# Patient Record
Sex: Male | Born: 1980
Health system: Southern US, Community
[De-identification: ages and names within clinical notes are randomized; demographics above are authoritative.]

---

## 2012-07-23 ENCOUNTER — Encounter (HOSPITAL_COMMUNITY): Payer: Self-pay | Admitting: Emergency Medicine

## 2012-07-23 ENCOUNTER — Emergency Department (INDEPENDENT_AMBULATORY_CARE_PROVIDER_SITE_OTHER)
Admission: EM | Admit: 2012-07-23 | Discharge: 2012-07-23 | Disposition: A | Discharge status: Home / Self Care | Payer: Self-pay | Source: Home / Self Care | Attending: Emergency Medicine | Admitting: Emergency Medicine

## 2012-07-23 DIAGNOSIS — IMO0002 Reserved for concepts with insufficient information to code with codable children: Secondary | ICD-10-CM

## 2012-07-23 DIAGNOSIS — T148XXA Other injury of unspecified body region, initial encounter: Secondary | ICD-10-CM

## 2012-07-23 MED ORDER — TETANUS-DIPHTH-ACELL PERTUSSIS 5-2.5-18.5 LF-MCG/0.5 IM SUSP
INTRAMUSCULAR | Status: AC
  Filled 2012-07-23: qty 0.5

## 2012-07-23 MED ORDER — TETANUS-DIPHTH-ACELL PERTUSSIS 5-2.5-18.5 LF-MCG/0.5 IM SUSP
0.5000 mL | Freq: Once | INTRAMUSCULAR | Status: AC
  Administered 2012-07-23: 0.5 mL via INTRAMUSCULAR

## 2012-07-23 NOTE — ED Notes (Signed)
Laceration to left hand thumb side. Pt states that about 6:45 p.m he was doing some work and cut his had with a utility knife. Left hand is wrapped and no bleeding at this time.Pt does not remember last tetanus.

## 2012-07-23 NOTE — ED Provider Notes (Signed)
Chief Complaint  Patient presents with  . Extremity Laceration    History of Present Illness:  Benjamin Porter is a 31 year old male who was lacerated and his left thenar eminence this evening around 6:45 PM with a utility knife. He states you to deny this claim. His last shot was more than 10 years ago. He is able to move all his extremities well. He denies any numbness or tingling.  Review of Systems:  Other than noted above, the patient denies any of the following symptoms: Systemic:  No fever or chills. Musculoskeletal:  No joint pain or decreased range of motion. Neuro:  No numbness, tingling, or weakness.  PMFSH:  Past medical history, family history, social history, meds, and allergies were reviewed.  Physical Exam:   Vital signs:  BP 128/83  Pulse 74  Temp 98.7 F (37.1 C) (Oral)  Resp 16  SpO2 100% Ext:  There is a 2.5 cm flap laceration on the thenar eminence. There was no visible contamination.  All joints had a full ROM without pain.  Pulses were full.  Good capillary refill in all digits.  No edema. Neurological:  Alert and oriented.  No muscle weakness.  Sensation was intact to light touch.   Procedure: Verbal informed consent was obtained.  The patient was informed of the risks and benefits of the procedure and understands and accepts.  Identity of the patient was verified verbally and by wristband.   The laceration area described above was prepped with Betadine and saline  and anesthetized with 10 mL of 2% Xylocaine without epinephrine.  The wound was then closed as follows:  The wound was closed with 8 5-0 nylon sutures.  There were no immediate complications, and the patient tolerated the procedure well. The laceration was then cleansed, Bacitracin ointment was applied and a clean, dry pressure dressing was put on.   Medications given in [facility]:  He was given a Tdap vaccine and tolerated this well without any immediate side effects.  Assessment:  The encounter diagnosis was  Laceration.  Plan:   1.  The following meds were prescribed:   New Prescriptions   No medications on file   2.  The patient was instructed in wound care and pain control, and handouts were given. 3.  The patient was told to return in 14 days for suture removal or wound recheck or sooner if any sign of infection.     Reuben Likes, MD 07/23/12 2155

## 2014-01-11 ENCOUNTER — Emergency Department (INDEPENDENT_AMBULATORY_CARE_PROVIDER_SITE_OTHER): Payer: BC Managed Care – PPO

## 2014-01-11 ENCOUNTER — Emergency Department (HOSPITAL_COMMUNITY)
Admission: EM | Admit: 2014-01-11 | Discharge: 2014-01-11 | Disposition: A | Discharge status: Home / Self Care | Payer: BC Managed Care – PPO | Source: Home / Self Care | Attending: Family Medicine | Admitting: Family Medicine

## 2014-01-11 ENCOUNTER — Encounter (HOSPITAL_COMMUNITY): Payer: Self-pay | Admitting: Emergency Medicine

## 2014-01-11 DIAGNOSIS — H6690 Otitis media, unspecified, unspecified ear: Secondary | ICD-10-CM

## 2014-01-11 DIAGNOSIS — J019 Acute sinusitis, unspecified: Secondary | ICD-10-CM

## 2014-01-11 DIAGNOSIS — H6693 Otitis media, unspecified, bilateral: Secondary | ICD-10-CM

## 2014-01-11 LAB — POCT RAPID STREP A: STREPTOCOCCUS, GROUP A SCREEN (DIRECT): NEGATIVE

## 2014-01-11 MED ORDER — IPRATROPIUM BROMIDE 0.06 % NA SOLN: 2.0000 | Freq: Four times a day (QID) | NASAL | Status: DC

## 2014-01-11 MED ORDER — MINOCYCLINE HCL 100 MG PO CAPS: 100.0000 mg | ORAL_CAPSULE | Freq: Two times a day (BID) | ORAL | Status: DC

## 2014-01-11 NOTE — ED Provider Notes (Signed)
CSN: [id number]     Arrival date & time 01/11/14  1613 History   First MD Initiated Contact with Patient 01/11/14 1654     Chief Complaint  Patient presents with  . URI   (Consider location/radiation/quality/duration/timing/severity/associated sxs/prior Treatment) Patient is a 33 y.o. male presenting with URI. The history is provided by the patient.  URI Presenting symptoms: congestion, cough, ear pain, fever and rhinorrhea   Severity:  Moderate Onset quality:  Gradual Duration:  5 days Progression:  Worsening Chronicity:  New Associated symptoms: sinus pain     History reviewed. No pertinent past medical history. History reviewed. No pertinent past surgical history. No family history on file. History  Substance Use Topics  . Smoking status: Never Smoker   . Smokeless tobacco: Not on file  . Alcohol Use: No    Review of Systems  Constitutional: Positive for fever.  HENT: Positive for congestion, ear pain and rhinorrhea.   Respiratory: Positive for cough. Negative for shortness of breath.   Gastrointestinal: Negative.   Genitourinary: Negative.     Allergies  Amoxicillin  Home Medications   Current Outpatient Rx  Name  Route  Sig  Dispense  Refill  . ipratropium (ATROVENT) 0.06 % nasal spray   Each Nare   Place 2 sprays into both nostrils 4 (four) times daily.   15 mL   1   . minocycline (MINOCIN,DYNACIN) 100 MG capsule   Oral   Take 1 capsule (100 mg total) by mouth 2 (two) times daily.   20 capsule   0    BP 132/83  Pulse 93  Temp(Src) 101.4 F (38.6 C) (Oral)  Resp 20  SpO2 96% Physical Exam  Nursing note and vitals reviewed. Constitutional: He is oriented to person, place, and time. He appears well-developed and well-nourished.  HENT:  Head: Normocephalic.  Right Ear: Ear canal normal. Tympanic membrane is injected, erythematous and retracted.  Left Ear: Ear canal normal. Tympanic membrane is injected and erythematous. Tympanic membrane mobility  is abnormal.  Ears:  Mouth/Throat: Oropharynx is clear and moist.  Neck: Normal range of motion. Neck supple.  Cardiovascular: Regular rhythm and normal heart sounds.   Pulmonary/Chest: Effort normal and breath sounds normal.  Lymphadenopathy:    He has no cervical adenopathy.  Neurological: He is alert and oriented to person, place, and time.  Skin: Skin is warm and dry.    ED Course  Procedures (including critical care time) Labs Review Labs Reviewed  POCT RAPID STREP A ([facility] ONLY)   Imaging Review Dg Chest 2 View  01/11/2014   CLINICAL DATA:  Cough, fever  EXAM: CHEST  2 VIEW  COMPARISON:  None.  FINDINGS: The heart size and mediastinal contours are within normal limits. Both lungs are clear. The visualized skeletal structures are unremarkable.  IMPRESSION: No active cardiopulmonary disease.   Electronically Signed   By: Elige KoHetal  Patel   On: 01/11/2014 17:36   X-rays reviewed and report per radiologist.   MDM   1. Sinusitis, acute   2. Otitis media of both ears        Linna HoffJames D Kindl, MD 01/11/14 1759

## 2014-01-11 NOTE — ED Notes (Signed)
Patient complains of chest congestion, head congestion, with cough fever; also states ear pressure; states can't sleep or lie down to sleep; states when lying down he is so congested he vomits.

## 2014-01-11 NOTE — Discharge Instructions (Signed)
Take all of medicine , use tylenol or advil for pain and fever as needed, see your doctor in 10 - 14 days for ear recheck or return as needed.

## 2014-01-13 LAB — CULTURE, GROUP A STREP

## 2014-02-22 ENCOUNTER — Emergency Department (INDEPENDENT_AMBULATORY_CARE_PROVIDER_SITE_OTHER): Payer: BC Managed Care – PPO

## 2014-02-22 ENCOUNTER — Encounter (HOSPITAL_COMMUNITY): Payer: Self-pay | Admitting: Emergency Medicine

## 2014-02-22 ENCOUNTER — Emergency Department (HOSPITAL_COMMUNITY)
Admission: EM | Admit: 2014-02-22 | Discharge: 2014-02-22 | Disposition: A | Discharge status: Home / Self Care | Payer: BC Managed Care – PPO | Source: Home / Self Care | Attending: Family Medicine | Admitting: Family Medicine

## 2014-02-22 DIAGNOSIS — M940 Chondrocostal junction syndrome [Tietze]: Secondary | ICD-10-CM

## 2014-02-22 MED ORDER — DICLOFENAC SODIUM 75 MG PO TBEC: 75.0000 mg | DELAYED_RELEASE_TABLET | Freq: Two times a day (BID) | ORAL | Status: DC | PRN

## 2014-02-22 NOTE — ED Provider Notes (Signed)
Robbie LisCharles Warnell is a 33 y.o. male who presents to Urgent Care today for left lateral rib pain present for about 2 weeks without injury. Pain is worse with coughing and deep inspiration. Patient is also work with picking up heavy objects. He notes that he's been coughing a lot recently. He denies any current fevers or chills nausea vomiting or diarrhea. He denies any trouble breathing. The pain is sharp located in the left lateral to anterior rib cage. The pain is moderate to severe but brief. He tried ibuprofen which has not helped much.   History reviewed. No pertinent past medical history. History  Substance Use Topics  . Smoking status: Never Smoker   . Smokeless tobacco: Not on file  . Alcohol Use: No   ROS as above Medications: No current facility-administered medications for this encounter.   Current Outpatient Prescriptions  Medication Sig Dispense Refill  . Multiple Vitamins-Minerals (MULTIVITAMIN PO) Take by mouth.      . diclofenac (VOLTAREN) 75 MG EC tablet Take 1 tablet (75 mg total) by mouth 2 (two) times daily as needed.  60 tablet  0  . ipratropium (ATROVENT) 0.06 % nasal spray Place 2 sprays into both nostrils 4 (four) times daily.  15 mL  1  . minocycline (MINOCIN,DYNACIN) 100 MG capsule Take 1 capsule (100 mg total) by mouth 2 (two) times daily.  20 capsule  0    Exam:  BP 112/71  Pulse 72  Temp(Src) 98.4 F (36.9 C) (Oral)  Resp 12  SpO2 100% Gen: Well NAD HEENT: EOMI,  MMM Lungs: Normal work of breathing. CTABL Heart: RRR no MRG Chest wall; tender palpation left lateral to anterior chest wall Abd: NABS, Soft. NT, ND Exts: Brisk capillary refill, warm and well perfused.   No results found for this or any previous visit (from the past 24 hour(s)). Dg Chest 2 View  02/22/2014   CLINICAL DATA:  Cough.  EXAM: CHEST  2 VIEW  COMPARISON:  DG RIBS UNILATERAL*L* dated 02/22/2014; DG CHEST 2 VIEW dated 01/11/2014  FINDINGS: Mediastinum and hilar structures normal. Lungs  are clear. Heart size normal.  IMPRESSION: No acute cardiopulmonary disease.   Electronically Signed   By: Maisie Fushomas  Register   On: 02/22/2014 10:30   Dg Ribs Unilateral Left  02/22/2014   CLINICAL DATA:  Cough.  Rib pain.  EXAM: LEFT RIBS - 2 VIEW  COMPARISON:  DG CHEST 2 VIEW dated 01/11/2014  FINDINGS: No fracture or other bone lesions are seen involving the ribs. No pneumothorax. No acute cardiopulmonary disease noted.  IMPRESSION: Negative.   Electronically Signed   By: Maisie Fushomas  Register   On: 02/22/2014 10:29    Assessment and Plan: [age] y.o. male with costochondritis. Plan to treat with diclofenac. Followup with primary care provider as needed.  Discussed warning signs or symptoms. Please see discharge instructions. Patient expresses understanding.    Rodolph BongEvan S Joy Haegele, MD 02/22/14 1153

## 2014-02-22 NOTE — Discharge Instructions (Signed)
Thank you for coming in today. Take voltaren twice daily as needed.  Call or go to the emergency room if you get worse, have trouble breathing, have chest pains, or palpitations.   Chest Wall Pain Chest wall pain is pain in or around the bones and muscles of your chest. It may take up to 6 weeks to get better. It may take longer if you must stay physically active in your work and activities.  CAUSES  Chest wall pain may happen on its own. However, it may be caused by:  A viral illness like the flu.  Injury.  Coughing.  Exercise.  Arthritis.  Fibromyalgia.  Shingles. HOME CARE INSTRUCTIONS   Avoid overtiring physical activity. Try not to strain or perform activities that cause pain. This includes any activities using your chest or your abdominal and side muscles, especially if heavy weights are used.  Put ice on the sore area.  Put ice in a plastic bag.  Place a towel between your skin and the bag.  Leave the ice on for 15-20 minutes per hour while awake for the first 2 days.  Only take over-the-counter or prescription medicines for pain, discomfort, or fever as directed by your caregiver. SEEK IMMEDIATE MEDICAL CARE IF:   Your pain increases, or you are very uncomfortable.  You have a fever.  Your chest pain becomes worse.  You have new, unexplained symptoms.  You have nausea or vomiting.  You feel sweaty or lightheaded.  You have a cough with phlegm (sputum), or you cough up blood. MAKE SURE YOU:   Understand these instructions.  Will watch your condition.  Will get help right away if you are not doing well or get worse. Document Released: 10/09/2005 Document Revised: 01/01/2012 Document Reviewed: 06/05/2011 [facility] Patient Information 2014 [place], [place].

## 2014-02-22 NOTE — ED Notes (Signed)
Reports extended cough from sinus infection followed by allergies x approx 1 month.  Approx 2 wks ago noticed a left mid-back pain while moving something at work.  Shortly thereafter started with a rib tenderness below left axilla radiating around to sternum; c/o "excruciating" pain to rib when sneezing.  Also c/o slight cough, but unable to bring up sputum.  Has taken IBU.

## 2014-05-11 ENCOUNTER — Emergency Department (HOSPITAL_COMMUNITY)
Admission: EM | Admit: 2014-05-11 | Discharge: 2014-05-11 | Disposition: A | Discharge status: Home / Self Care | Payer: BC Managed Care – PPO | Source: Home / Self Care

## 2014-05-11 ENCOUNTER — Encounter (HOSPITAL_COMMUNITY): Payer: Self-pay | Admitting: Emergency Medicine

## 2014-05-11 DIAGNOSIS — J020 Streptococcal pharyngitis: Secondary | ICD-10-CM

## 2014-05-11 LAB — POCT RAPID STREP A: Streptococcus, Group A Screen (Direct): NEGATIVE

## 2014-05-11 MED ORDER — AZITHROMYCIN 250 MG PO TABS: ORAL_TABLET | ORAL | Status: DC

## 2014-05-11 NOTE — ED Notes (Signed)
Pt  Reports  Symptoms  Of  sorethroat      X  3  Days  Had  Fever  Yesterday   Some  Congestion  And  Pain  When  Swallowing

## 2014-05-11 NOTE — ED Provider Notes (Signed)
CSN: [id number]     Arrival date & time 05/11/14  1110 History   None    Chief Complaint  Patient presents with  . Sore Throat   (Consider location/radiation/quality/duration/timing/severity/associated sxs/prior Treatment) HPI He is here today for evaluation of sore throat. He states his symptoms started on Friday night, with some mild throat eructation and headache. On Saturday he developed a sore throat, continued headache, and fever to 101.3. This was associated with some body aches. He also describes some tender lymph nodes in his neck. States he saw some exudate on his tonsils. He was at a party approximately one week ago, where he was exposed to a child with known strep pharyngitis. He is tolerating by mouth well.  History reviewed. No pertinent past medical history. History reviewed. No pertinent past surgical history. History reviewed. No pertinent family history. History  Substance Use Topics  . Smoking status: Never Smoker   . Smokeless tobacco: Not on file  . Alcohol Use: No    Review of Systems  Constitutional: Positive for fever. Negative for appetite change.  HENT: Positive for sore throat. Negative for rhinorrhea, sinus pressure and trouble swallowing.   Eyes: Negative.   Respiratory: Negative for cough and shortness of breath.   Musculoskeletal: Positive for myalgias.  Skin: Negative for rash.  Neurological: Positive for headaches.    Allergies  Amoxicillin  Home Medications   Prior to Admission medications   Medication Sig Start Date End Date Taking? Authorizing Provider  azithromycin (ZITHROMAX Z-PAK) 250 MG tablet Take 2 tablets today, then 1 tablet daily until gone. 05/11/14   Charm RingsErin J Trecia Maring, MD  diclofenac (VOLTAREN) 75 MG EC tablet Take 1 tablet (75 mg total) by mouth 2 (two) times daily as needed. 02/22/14   Rodolph BongEvan S Corey, MD  ipratropium (ATROVENT) 0.06 % nasal spray Place 2 sprays into both nostrils 4 (four) times daily. 01/11/14   Linna HoffJames D Kindl, MD   minocycline (MINOCIN,DYNACIN) 100 MG capsule Take 1 capsule (100 mg total) by mouth 2 (two) times daily. 01/11/14   Linna HoffJames D Kindl, MD  Multiple Vitamins-Minerals (MULTIVITAMIN PO) Take by mouth.    Historical Provider, MD   BP 129/88  Pulse 73  Temp(Src) 98.3 F (36.8 C) (Oral)  Resp 12  SpO2 100% Physical Exam  Constitutional: He appears well-developed and well-nourished. No distress.  HENT:  Head: Normocephalic and atraumatic.  Mouth/Throat: Mucous membranes are normal. Oropharyngeal exudate and posterior oropharyngeal erythema present. No tonsillar abscesses.  4+ tonsils  Neck: Normal range of motion.  Cardiovascular: Normal rate, regular rhythm and normal heart sounds.   Pulmonary/Chest: Effort normal and breath sounds normal. He has no wheezes. He has no rales.  Lymphadenopathy:    He has cervical adenopathy.  Skin: He is not diaphoretic.    ED Course  Procedures (including critical care time) Labs Review Labs Reviewed  POCT RAPID STREP A ([facility] ONLY)    Imaging Review No results found.   MDM   1. Strep throat    Rapid strep is negative. However, he has a known exposure and his centor score is 4, indicating high probability of strep pharyngitis. Given his allergy to amoxicillin, will treat with Z-Pak. Followup as needed.    Charm RingsErin J Seher Schlagel, MD 05/11/14 [phone]

## 2014-05-11 NOTE — Discharge Instructions (Signed)
You likely have strep throat. Take azithromycin - 2 tablets today, then 1 a day until gone.  If you symptoms persist after completing the antibiotics, please come back.

## 2014-05-13 LAB — CULTURE, GROUP A STREP

## 2015-12-18 ENCOUNTER — Emergency Department (HOSPITAL_COMMUNITY)
Admission: EM | Admit: 2015-12-18 | Discharge: 2015-12-18 | Disposition: A | Discharge status: Home / Self Care | Payer: BLUE CROSS/BLUE SHIELD | Attending: Emergency Medicine | Admitting: Emergency Medicine

## 2015-12-18 ENCOUNTER — Encounter (HOSPITAL_COMMUNITY): Payer: Self-pay | Admitting: Emergency Medicine

## 2015-12-18 ENCOUNTER — Emergency Department (HOSPITAL_COMMUNITY): Payer: BLUE CROSS/BLUE SHIELD

## 2015-12-18 DIAGNOSIS — Z79899 Other long term (current) drug therapy: Secondary | ICD-10-CM | POA: Diagnosis not present

## 2015-12-18 DIAGNOSIS — R0781 Pleurodynia: Secondary | ICD-10-CM | POA: Insufficient documentation

## 2015-12-18 DIAGNOSIS — R059 Cough, unspecified: Secondary | ICD-10-CM

## 2015-12-18 DIAGNOSIS — Z880 Allergy status to penicillin: Secondary | ICD-10-CM | POA: Diagnosis not present

## 2015-12-18 DIAGNOSIS — Z8739 Personal history of other diseases of the musculoskeletal system and connective tissue: Secondary | ICD-10-CM | POA: Insufficient documentation

## 2015-12-18 DIAGNOSIS — R0981 Nasal congestion: Secondary | ICD-10-CM | POA: Insufficient documentation

## 2015-12-18 DIAGNOSIS — R05 Cough: Secondary | ICD-10-CM | POA: Insufficient documentation

## 2015-12-18 NOTE — ED Notes (Signed)
Pt reports he has had URI symptoms and a cough for the past week. Pt had had R armpit/rib pain with coughing since yesterday. Pt had a coughing spell this am and felt a pop on his R lateral rib area. Since then rib pain with coughing/movement has been worse. Pt had tested positive for strep throat earlier in the week, but he has completed his abx.

## 2015-12-18 NOTE — Discharge Instructions (Signed)
Use your diclofenac for your rib pain. Use the cough suppressant you have at home or OTC delsym or robitussin for cough relief. Continue icing the area if this helps your pain. Schedule a follow up with a PCP (one of your choosing or from the resource guide) if your symptoms do not improve.    Chest Wall Pain Chest wall pain is pain in or around the bones and muscles of your chest. Sometimes, an injury causes this pain. Sometimes, the cause may not be known. This pain may take several weeks or longer to get better. HOME CARE INSTRUCTIONS  Pay attention to any changes in your symptoms. Take these actions to help with your pain:   Rest as told by your health care provider.   Avoid activities that cause pain. These include any activities that use your chest muscles or your abdominal and side muscles to lift heavy items.   If directed, apply ice to the painful area:  Put ice in a plastic bag.  Place a towel between your skin and the bag.  Leave the ice on for 20 minutes, 2-3 times per day.  Take over-the-counter and prescription medicines only as told by your health care provider.  Do not use tobacco products, including cigarettes, chewing tobacco, and e-cigarettes. If you need help quitting, ask your health care provider.  Keep all follow-up visits as told by your health care provider. This is important. SEEK MEDICAL CARE IF:  You have a fever.  Your chest pain becomes worse.  You have new symptoms. SEEK IMMEDIATE MEDICAL CARE IF:  You have nausea or vomiting.  You feel sweaty or light-headed.  You have a cough with phlegm (sputum) or you cough up blood.  You develop shortness of breath.   This information is not intended to replace advice given to you by your health care provider. Make sure you discuss any questions you have with your health care provider.   Document Released: 10/09/2005 Document Revised: 06/30/2015 Document Reviewed: 01/04/2015 Elsevier Interactive  Patient Education 2016 Elsevier Inc.  Cough, Adult Coughing is a reflex that clears your throat and your airways. Coughing helps to heal and protect your lungs. It is normal to cough occasionally, but a cough that happens with other symptoms or lasts a long time may be a sign of a condition that needs treatment. A cough may last only 2-3 weeks (acute), or it may last longer than 8 weeks (chronic). CAUSES Coughing is commonly caused by:  Breathing in substances that irritate your lungs.  A viral or bacterial respiratory infection.  Allergies.  Asthma.  Postnasal drip.  Smoking.  Acid backing up from the stomach into the esophagus (gastroesophageal reflux).  Certain medicines.  Chronic lung problems, including COPD (or rarely, lung cancer).  Other medical conditions such as heart failure. HOME CARE INSTRUCTIONS  Pay attention to any changes in your symptoms. Take these actions to help with your discomfort:  Take medicines only as told by your health care provider.  If you were prescribed an antibiotic medicine, take it as told by your health care provider. Do not stop taking the antibiotic even if you start to feel better.  Talk with your health care provider before you take a cough suppressant medicine.  Drink enough fluid to keep your urine clear or pale yellow.  If the air is dry, use a cold steam vaporizer or humidifier in your bedroom or your home to help loosen secretions.  Avoid anything that causes you to cough  at work or at home.  If your cough is worse at night, try sleeping in a semi-upright position.  Avoid cigarette smoke. If you smoke, quit smoking. If you need help quitting, ask your health care provider.  Avoid caffeine.  Avoid alcohol.  Rest as needed. SEEK MEDICAL CARE IF:   You have new symptoms.  You cough up pus.  Your cough does not get better after 2-3 weeks, or your cough gets worse.  You cannot control your cough with suppressant  medicines and you are losing sleep.  You develop pain that is getting worse or pain that is not controlled with pain medicines.  You have a fever.  You have unexplained weight loss.  You have night sweats. SEEK IMMEDIATE MEDICAL CARE IF:  You cough up blood.  You have difficulty breathing.  Your heartbeat is very fast.   This information is not intended to replace advice given to you by your health care provider. Make sure you discuss any questions you have with your health care provider.   Document Released: 04/07/2011 Document Revised: 06/30/2015 Document Reviewed: 12/16/2014 Elsevier Interactive Patient Education 2016 [employer].   Emergency Department Resource Guide 1) Find a Doctor and Pay Out of Pocket Although you won't have to find out who is covered by your insurance plan, it is a good idea to ask around and get recommendations. You will then need to call the office and see if the doctor you have chosen will accept you as a new patient and what types of options they offer for patients who are self-pay. Some doctors offer discounts or will set up payment plans for their patients who do not have insurance, but you will need to ask so you aren't surprised when you get to your appointment.  2) Contact Your Local Health Department Not all health departments have doctors that can see patients for sick visits, but many do, so it is worth a call to see if yours does. If you don't know where your local health department is, you can check in your phone book. The CDC also has a tool to help you locate your state's health department, and many state websites also have listings of all of their local health departments.  3) Find a Walk-in Clinic If your illness is not likely to be very severe or complicated, you may want to try a walk in clinic. These are popping up all over the country in pharmacies, drugstores, and shopping centers. They're usually staffed by nurse practitioners or  physician assistants that have been trained to treat common illnesses and complaints. They're usually fairly quick and inexpensive. However, if you have serious medical issues or chronic medical problems, these are probably not your best option.  No Primary Care Doctor: - Call Health Connect at  [phone] - they can help you locate a primary care doctor that  accepts your insurance, provides certain services, etc. - Physician Referral Service- [phone]  Chronic Pain Problems: Organization         Address  Phone   Notes  [facility]  [phone] Patients need to be referred by their primary care doctor.   Medication Assistance: Organization         Address  Phone   Notes  [facility] Medication [facility] [address] [place] [place], [place] [address] [phone] --Must be a resident of [facility] -- Must have NO insurance coverage whatsoever (no Medicaid/ Medicare, etc.) -- The pt.  MUST have a primary care doctor that directs their care regularly and follows them in the community   Med[facility]ist  [phone]   [employer]  [phone]    Agencies that provide inexpensive medical care: Organization         Address  Phone   Notes  [facility]  [phone]   [facility]    [phone]   [facility] [address] [place], [place] [address] [phone]   [facility] [address] [place] [place] [phone]   Planned Parenthood    [phone]   [facility]    [phone]   Community [facility] and [facility]  [address], [place] Phone:  [phone], Fax:  [phone] Hours of Operation:  9 am - 6 pm, M-F.  Also accepts Medicaid/Medicare and self-pay.  [facility] for Children  [address], [place] Phone: [phone], Fax: [phone]. Hours of Operation:  8:30 am - 5:30 pm, M-F.   Also accepts Medicaid and self-pay.  [facility] [place] [address] [place] Phone: [phone]   [facility] [address] Natasha Bence [place], [place] [phone], Ext. [phone] Mondays & Thursdays: 7-9 AM.  First 15 patients are seen on a first come, first serve basis.    Medicaid-accepting [facility] Providers:  Organization         Address  Phone   Notes  [facility] [address],  [phone] Also accepts self-pay patients.  [facility] [address] Laurell Josephs [place], [place]  [phone]   [facility] [address], [place] [phone]   [facility] [address] [place] [phone]   Renaye Rakers [address], [place]   [phone] Only accepts [place] Access [place] patients after they have their name applied to their card.   Self-Pay (no insurance) in [facility]:  Organization         Address  Phone   Notes  Sickle Cell Patients, [facility] [address] [place], [place] [phone]   [facility] [address] [place], [place] [phone]   [facility] [place]  [address], [place] [phone]   [facility]/Dr. Osei-Bonsu  [address], [place] or [address], [place] [phone] Phone number for both [place] and [place] locations is the same.  [facility] and [facility] [address], [place] [phone]   [facility] [address] [place] or [address] [phone]   [facility] [address], [place] [phone], phone; [phone], fax Sees patients 1st and 3rd Saturday of every month.  Must not qualify for public or private insurance (i.e. Medicaid, Medicare, [place] Health Choice, Veterans'  Benefits)  Household income should be no more than 200% of the poverty level The clinic cannot treat you if you are pregnant or think you are pregnant  Sexually transmitted diseases are not treated at the clinic.    Dental Care: Organization         Address  Phone  Notes  [facility] Department of Public Health [facility] [address]  Lynne Logan [phone] Accepts children up to age 68 who are enrolled in Medicaid or [place] Health Choice; pregnant women with a Medicaid card; and children who have applied for Medicaid or [place] Health Choice, but were declined, whose parents can pay a reduced fee at time of service.  [facility]  [address], [place] [phone] Accepts children up to age 51 who are enrolled in [place] or [place] Health Choice; pregnant women with a Medicaid card; and children who have applied for Medicaid or  Health Choice, but were declined, whose parents can pay a reduced fee at time of service.  [facility]  [address] [place], [place] [phone] Patients are seen by appointment only. Walk-ins are not accepted. [facility] will see patients 54 years of age and older. Monday - Tuesday (8am-5pm) Most Wednesdays (8:30-5pm) $30 per visit, cash only  [facility] Access [facility]  [address], [facility] [phone] Patients are seen by appointment only. Walk-ins are not accepted. [facility] will see patients 31 years of age and older. One Wednesday Evening (Monthly: Volunteer Based).  $30 per visit, cash only  [employer]  [phone] for adults; Children under age 56, call Graduate Pediatric Dentistry at [phone]. Children aged 28-14, please call [phone] to request a pediatric application.  Dental services are provided in all areas of dental care including fillings, crowns and bridges, complete and partial  dentures, implants, gum treatment, root canals, and extractions. Preventive care is also provided. Treatment is provided to both adults and children. Patients are selected via a lottery and there is often a waiting list.   [facility] [address], [place]  [phone] [email or web address]   [facility] [address] [place], [place] [phone], Ext. [phone] Second and Fourth Thursday of each month, opens at 6:30 AM; Clinic ends at 9 AM.  Patients are seen on a first-come first-served basis, and a limited number are seen during each clinic.   [facility]  [address] Ether Griffins [place], [place] [phone]   Eligibility Requirements You must have lived in [place], [place], or [place] counties for at least the last three months.   You cannot be eligible for state or federal sponsored [place], including [employer], [place], or [employer].   You generally cannot be eligible for healthcare insurance through your employer.    How to apply: Eligibility screenings are held every Tuesday and Wednesday afternoon from 1:00 pm until 4:00 pm. You do not need an appointment for the interview!  [facility] [address], [place], [place] [phone]   [facility] Health Department  [phone]   [facility] Health Department  [phone]   [facility] Health Department  [phone]    Behavioral Health Resources in the Community: Intensive Outpatient Programs Organization         Address  Phone  Notes  [facility] Services [address], [place], [place] [phone]   [facility] Outpatient [address], [place], [place] [phone]   [facility] [address], [place], [place]  [phone]   [facility] [address],  [place], [place] [phone] or [phone]   Substance Abuse Resources Organization          Address  Phone  Notes  [facility] Drug  Services  [phone]   [facility]  [phone]   [facility] [place]  [phone]   Floydene Flock  [phone]   Residential & Outpatient Substance Abuse Program  [phone]   Psychological Services Organization         Address  Phone  Notes  [facility] Health  [phone]   [facility]  [phone]   [facility] Mental Health [address] [place] [phone] or [phone]    Mobile Crisis Teams Organization         Address  Phone  Notes  [facility], Mobile Crisis Care Unit  [phone]   [facility]  [address] [place], [place] [phone]   Doristine Locks [address] [place] [phone]    Self-Help/Support Groups Organization         Address  Phone             Notes  [facility] [place] - variety of support groups  336- I7437963 Call for more information  Narcotics Anonymous (NA), [facility] [address], [employer] [place]  2 meetings at this location   Statistician         Address  Phone  Notes  [facility] [address],    [place]  [phone]   [facility]  [address] [place] [address], [place], [place] [phone]   [facility] [address] [place], [place] [phone] Admissions: 8am-3pm M-F  [facility] [address],    [place], [place] [phone]   [facility] [address] [place], [place], [place] [phone]   [facility] [address],  [place], [place] [phone]   [facility] - Intensive Outpatient [address] Laurell Josephs [address], [place], [place] [phone]   [facility] ([facility] [address] [place]  [place], [place] [phone] or [phone]   [facility] ([facility]) [address], [place], [place]  [phone] Accepts Medicaid  [facility] [address],  [place] [phone] Substance Abuse/Addiction Treatment   [facility] Organization         Address  Phone  Notes  [facility]  [phone]   Angie Fava, PhD [address] Ervin Knack [place], [place]   5314672118 or [phone]   [facility] Behavioral   [address] [place], [place] ([phone]   [facility] [address], [place], [place] [phone] Insurance/Medicaid/sponsorship through [facility] and Families [address]                                    [place], [place] [phone] Therapy/tele-psych/case  [facility] [address], [place] [phone]    Dr. Lolly Mustache  [phone]   [facility]  [employer] [facility] Dept. 1) [address], [place] 2) [address], [place] 3)  [address], [place] [phone]  [phone]   [facility] Child Abuse Hotline [phone]) [phone] or [phone] (After Hours)

## 2015-12-18 NOTE — ED Notes (Signed)
Patient transported to X-ray 

## 2015-12-18 NOTE — ED Provider Notes (Signed)
CSN: [id number]     Arrival date & time 12/18/15  1210 History  By signing my name below, I, Benjamin Porter, attest that this documentation has been prepared under the direction and in the presence of Benjamin Cuny, PA-C. Electronically Signed: Lyndel Porter, ED Scribe. 12/18/2015. 2:10 PM.   Chief Complaint  Patient presents with  . Cough  . Chest Pain   The history is provided by the patient. No language interpreter was used.   HPI Comments: Benjamin Porter is a 35 y.o. male, with a h/o costochondritis, who presents to the Emergency Department complaining of a cough that has been persistent for 2 weeks, with associated nasal congestion and bright yellow sputum. Pt reports acute onset of right, lateral rib pain this morning during a coughing spell prompting ED evaluation. He states he heard something pop in his right ribs this morning and has experienced exacerbation of the pain with coughing and movement of torso since this incident. Pt admits to improvement of rib pain with ice applied in triage. Additionally he reports episodes of blood streaked sputum after coughing. He has not taken any alleviating medication for cough but has taken tylenol and diclofenac without significant relief of rib pain. He completed an azithromycin course 2 days ago for treatment of strep and denies a sore throat currently. He states that his URI symptoms have improved after treatment and he is mostly concerned about the rib pain. He has prescription cough medication at home but "wanted to get my ribs checked out before I took it". Pt also denies fevers, chills, sinus pressure, sinus headache, sore throat, SOB, difficulty breathing, chest pain, abdominal pain, nausea, vomiting, diarrhea, myalgias or arthralgias.   History reviewed. No pertinent past medical history. History reviewed. No pertinent past surgical history. History reviewed. No pertinent family history. Social History  Substance Use Topics  . Smoking  status: Never Smoker   . Smokeless tobacco: None  . Alcohol Use: No    Review of Systems  Constitutional: Negative for fever and chills.  HENT: Positive for congestion ( nasal). Negative for sinus pressure, sore throat, trouble swallowing and voice change.   Respiratory: Positive for cough. Negative for shortness of breath.   Cardiovascular: Negative for chest pain.  Musculoskeletal: Positive for arthralgias ( right, lateral ribs).  Neurological: Negative for headaches.  All other systems reviewed and are negative.  Allergies  Amoxicillin  Home Medications   Prior to Admission medications   Medication Sig Start Date End Date Taking? Authorizing Provider  azithromycin (ZITHROMAX Z-PAK) 250 MG tablet Take 2 tablets today, then 1 tablet daily until gone. 05/11/14   Benjamin Rings, MD  diclofenac (VOLTAREN) 75 MG EC tablet Take 1 tablet (75 mg total) by mouth 2 (two) times daily as needed. 02/22/14   Benjamin Bong, MD  ipratropium (ATROVENT) 0.06 % nasal spray Place 2 sprays into both nostrils 4 (four) times daily. 01/11/14   Benjamin Hoff, MD  minocycline (MINOCIN,DYNACIN) 100 MG capsule Take 1 capsule (100 mg total) by mouth 2 (two) times daily. 01/11/14   Benjamin Hoff, MD  Multiple Vitamins-Minerals (MULTIVITAMIN PO) Take by mouth.    Historical Provider, MD   BP 132/83 mmHg  Pulse 73  Temp(Src) 98.3 F (36.8 C) (Oral)  Resp 18  SpO2 97% Physical Exam  Constitutional: He is oriented to person, place, and time. He appears well-developed and well-nourished. No distress.  Nontoxic appearing  HENT:  Head: Normocephalic.  Right Ear: Tympanic membrane and ear canal  normal.  Left Ear: Tympanic membrane and ear canal normal.  Nose: Nose normal. Right sinus exhibits no maxillary sinus tenderness and no frontal sinus tenderness. Left sinus exhibits no maxillary sinus tenderness and no frontal sinus tenderness.  Mouth/Throat: Uvula is midline and oropharynx is clear and moist.  Eyes:  Conjunctivae are normal. Right eye exhibits no discharge. Left eye exhibits no discharge.  Neck: Normal range of motion. Neck supple.  Cardiovascular: Normal rate, regular rhythm and normal heart sounds.   Pulmonary/Chest: Effort normal and breath sounds normal. No respiratory distress. He has no wheezes. He has no rales. He exhibits no tenderness.  Patient indicates right lateral ribs as source of pain. No tenderness to palpation over this area. No bony deformities, flail chest, crepitus or step-offs. Breathing unlabored. Symmetric chest expansion.  Musculoskeletal: Normal range of motion.  Neurological: He is alert and oriented to person, place, and time. Coordination normal.  Skin: Skin is warm.  Psychiatric: He has a normal mood and affect. His behavior is normal.  Nursing note and vitals reviewed.   ED Course  Procedures  DIAGNOSTIC STUDIES: Oxygen Saturation is 97% on RA, normal by my interpretation.    COORDINATION OF CARE: 2:06 PM Suspect costochondritis. Pt has diclofenac prescription at home. Discussed unremarkable CXR with pt. Discussed treatment plan with pt. Pt acknowledges and agrees to plan.   Imaging Review Dg Chest 2 View  12/18/2015  CLINICAL DATA:  35 year old male with cough and congestion for 2 weeks. EXAM: CHEST  2 VIEW COMPARISON:  02/22/2014 and prior exam FINDINGS: The cardiomediastinal silhouette is unremarkable. There is no evidence of focal airspace disease, pulmonary edema, suspicious pulmonary nodule/mass, pleural effusion, or pneumothorax. No acute bony abnormalities are identified. IMPRESSION: No active cardiopulmonary disease. Electronically Signed   By: Benjamin Porter M.D.   On: 12/18/2015 13:18   I have personally reviewed and evaluated these images results as part of my medical decision-making.   MDM   Final diagnoses:  Cough  Rib pain on right side   35 year old male presenting with right lateral rib pain after a coughing fit this morning. Patient  has been experiencing URI symptoms for the past 2 weeks. Recently completed azithromycin prescribed from an urgent care. Afebrile and nontoxic appearing. No tenderness on palpation of the right lateral ribs. No bony deformities of the rib cage. Breathing unlabored with normal breath sounds. No nasal congestion noted. Chest x-ray negative for acute disease or rib abnormality. Patient has cough syrup with codeine at home but has not taken this yet. Patient recently finished azithromycin course and I do not believe he needs further antibiotics at this time. Discussed symptomatic care including his home regimen of diclofenac and over-the-counter cough suppressants as needed. Patient states he does not have a PCP but is in the process of establishing care. Given resource guide in discharge report and encouraged to schedule a follow-up appointment if his symptoms do not improve. Return precautions given in discharge paperwork and discussed with pt at bedside. Pt stable for discharge  I personally performed the services described in this documentation, which was scribed in my presence. The recorded information has been reviewed and is accurate.    Rolm Gala Taraann Olthoff, PA-C 12/18/15 1439  Bethann Berkshire, MD 12/19/15 [id number]

## 2017-07-25 IMAGING — CR DG CHEST 2V
2 series · 2 of 2 positions shown · non-contrast
Comparison: 02/22/2014 and prior exam

CLINICAL DATA: 34-year-old male with cough and congestion for 2
weeks.

EXAM:
CHEST  2 VIEW

[w chest pa]
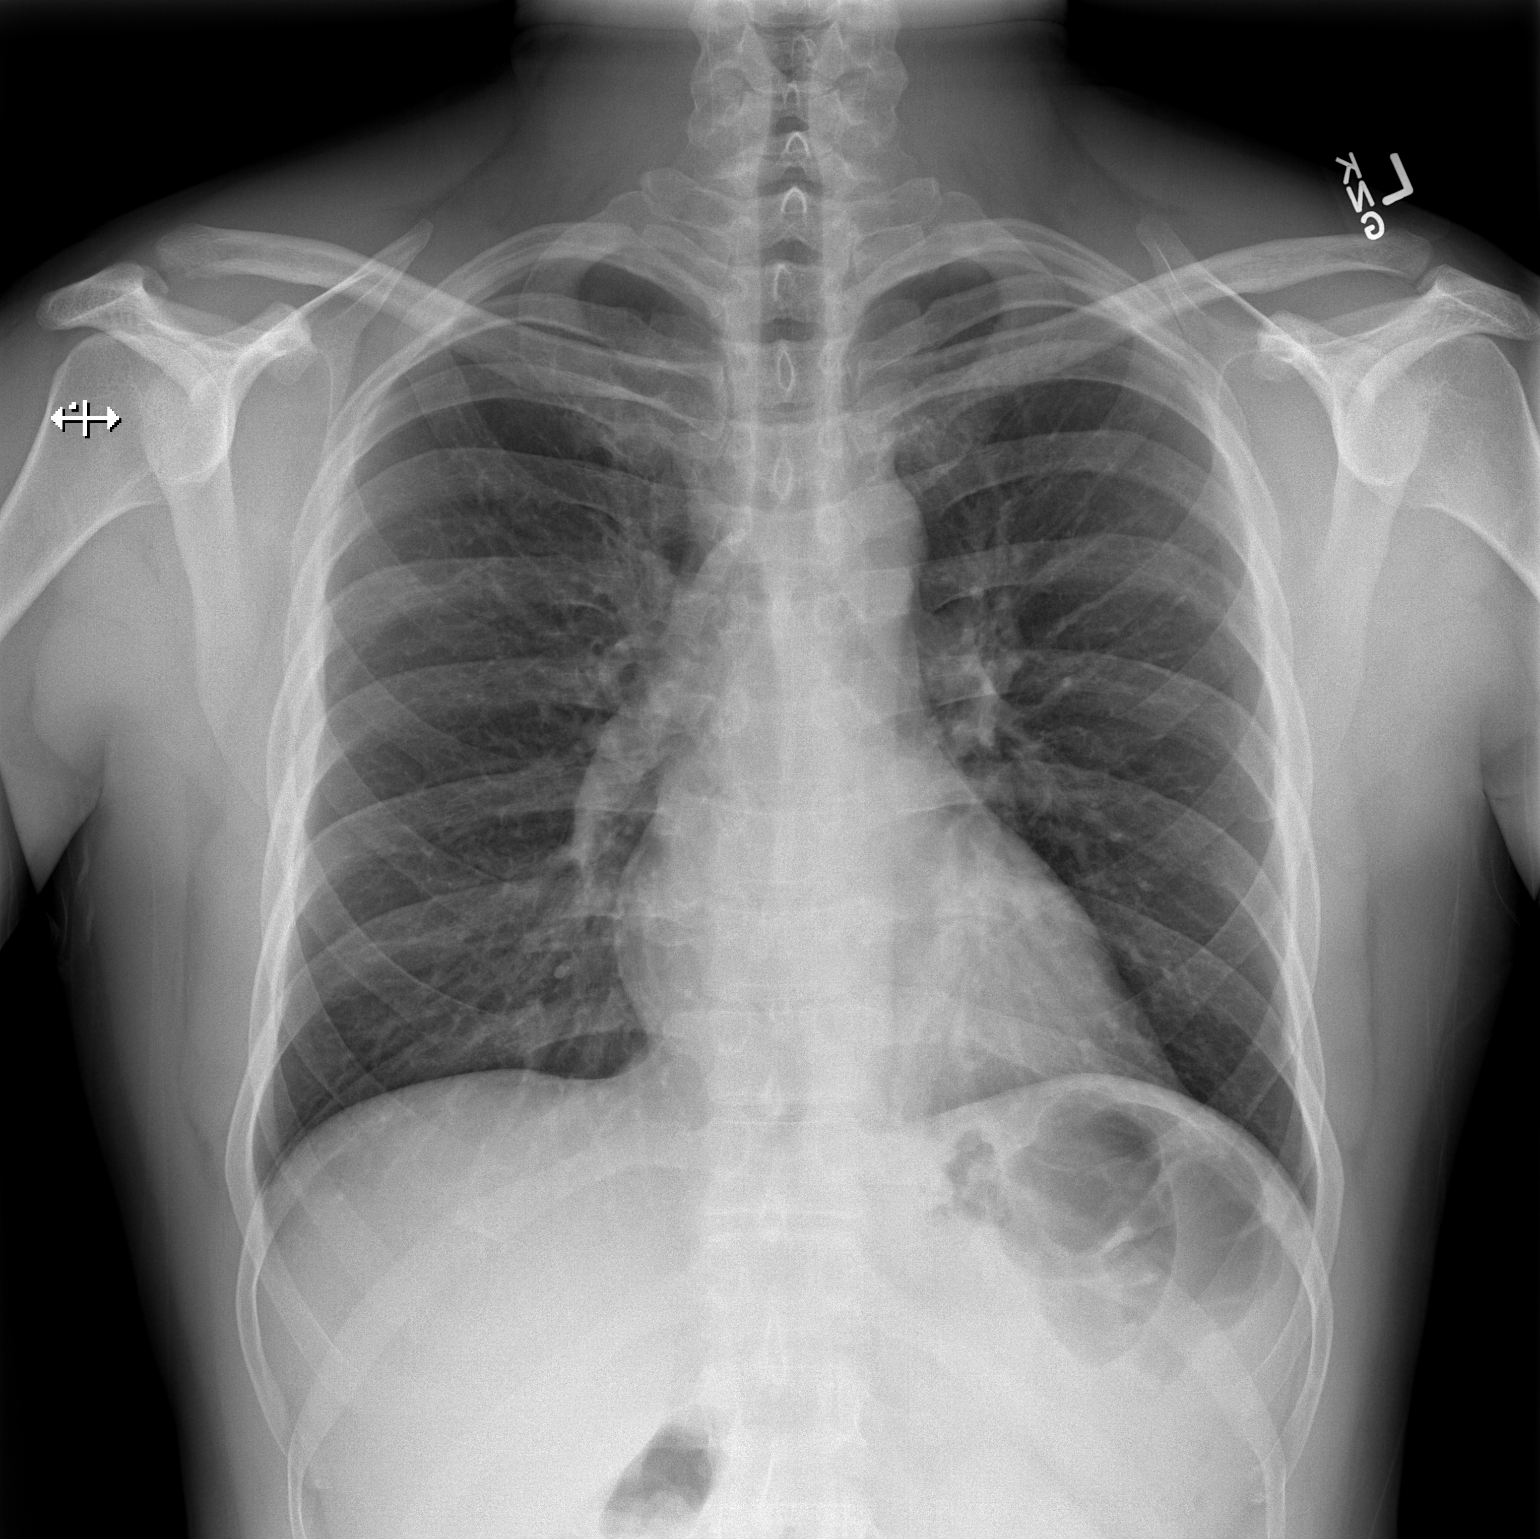

[w chest lat]
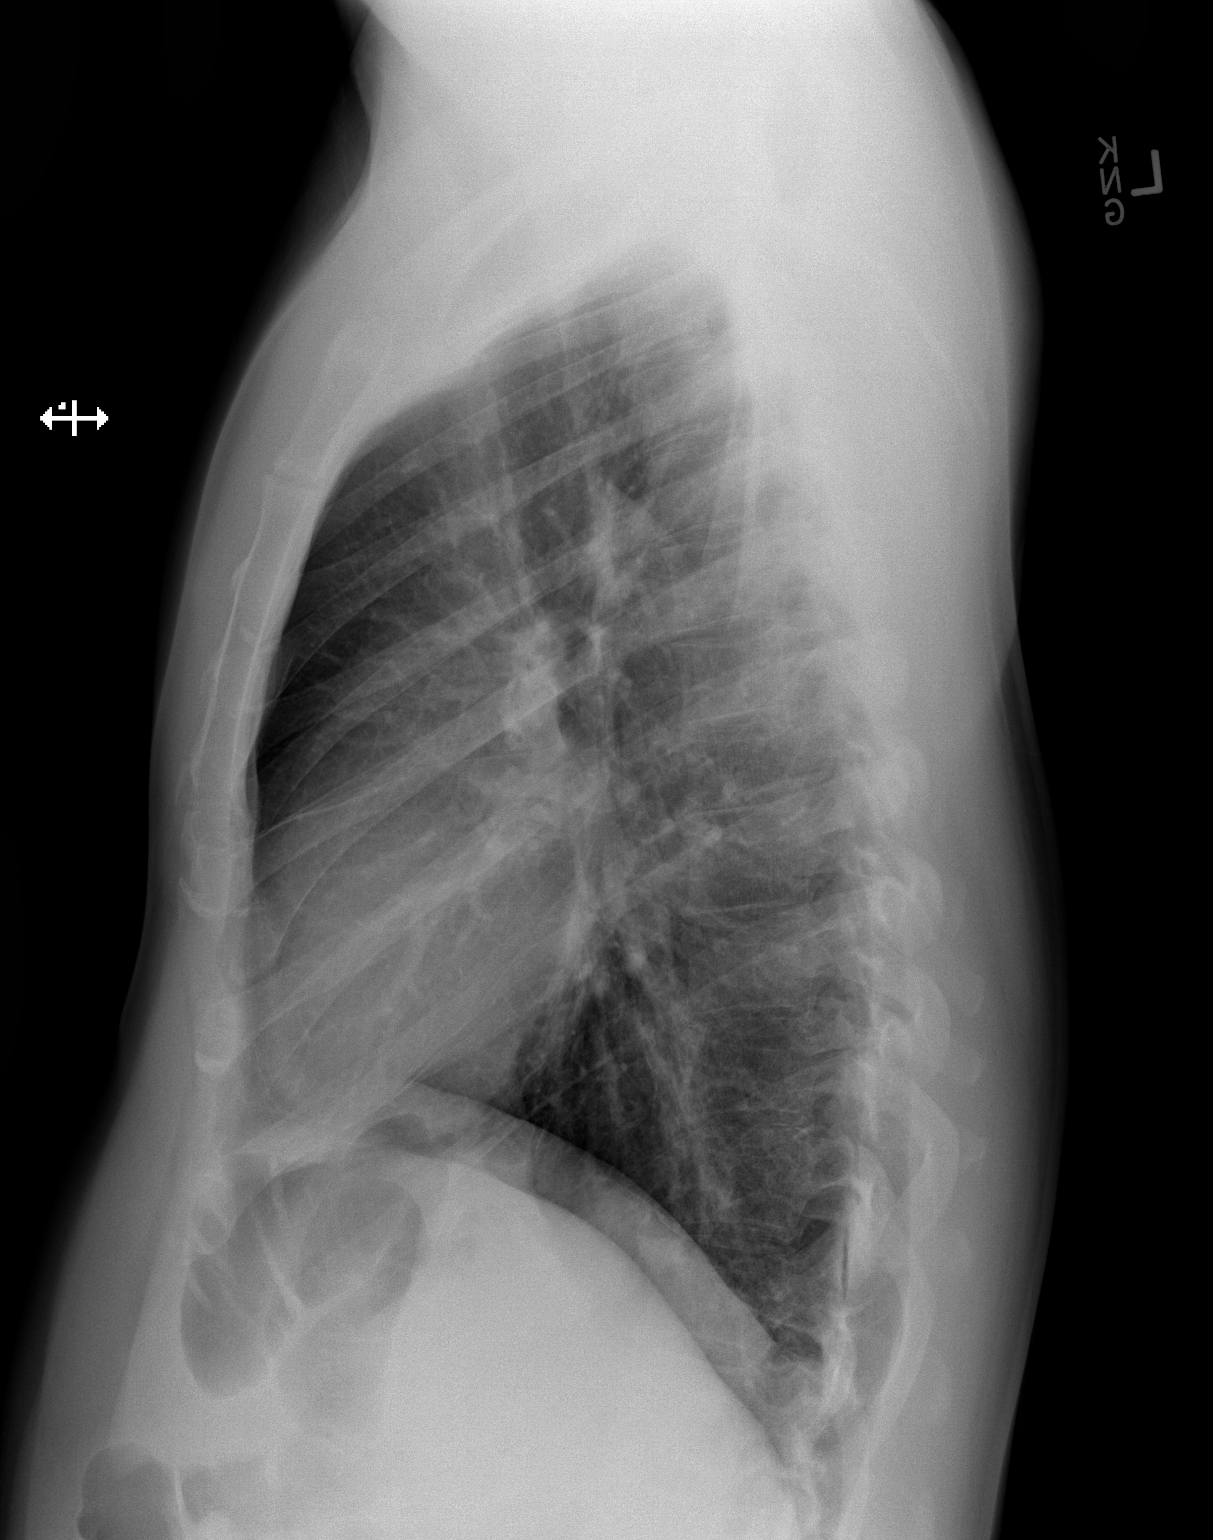

[2 of 2 positions shown; findings below may reference images not displayed]

FINDINGS: The cardiomediastinal silhouette is unremarkable.

There is no evidence of focal airspace disease, pulmonary edema,
suspicious pulmonary nodule/mass, pleural effusion, or pneumothorax.
No acute bony abnormalities are identified.
IMPRESSION: No active cardiopulmonary disease.

## 2017-09-18 ENCOUNTER — Other Ambulatory Visit: Payer: Self-pay

## 2018-11-07 ENCOUNTER — Ambulatory Visit
Admission: EM | Admit: 2018-11-07 | Discharge: 2018-11-07 | Disposition: A | Discharge status: Home / Self Care | Payer: BLUE CROSS/BLUE SHIELD | Attending: Physician Assistant | Admitting: Physician Assistant

## 2018-11-07 ENCOUNTER — Ambulatory Visit: Admit: 2018-11-07 | Discharge status: Absent without leave | Payer: Self-pay | Source: Home / Self Care

## 2018-11-07 DIAGNOSIS — B370 Candidal stomatitis: Secondary | ICD-10-CM

## 2018-11-07 DIAGNOSIS — J029 Acute pharyngitis, unspecified: Secondary | ICD-10-CM

## 2018-11-07 LAB — POCT RAPID STREP A (OFFICE): Rapid Strep A Screen: NEGATIVE

## 2018-11-07 MED ORDER — FLUCONAZOLE 200 MG PO TABS
200.0000 mg | ORAL_TABLET | Freq: Every day | ORAL | 0 refills | Status: AC
Start: 2018-11-07 — End: 2018-11-14

## 2018-11-07 MED ORDER — CEFDINIR 300 MG PO CAPS: 300.0000 mg | ORAL_CAPSULE | Freq: Two times a day (BID) | ORAL | 0 refills | Status: DC

## 2018-11-07 MED ORDER — ACETAMINOPHEN 325 MG PO TABS
650.0000 mg | ORAL_TABLET | Freq: Once | ORAL | Status: AC
  Administered 2018-11-07: 650 mg via ORAL

## 2018-11-07 NOTE — Discharge Instructions (Signed)
Return if any problems.  See your Physician for recheck in 1 week   °

## 2018-11-07 NOTE — ED Triage Notes (Signed)
Pt c/o fever, cough, sore throat x5 days. Took Dayquil at [facility]

## 2018-11-07 NOTE — ED Provider Notes (Signed)
[name]    CSN: [id number] Arrival date & time: 11/07/18  1305     History   Chief Complaint Chief Complaint  Patient presents with  . Fever    HPI Benjamin Porter is a 38 y.o. male.   The history is provided by the patient. No language interpreter was used.  Fever  Max temp prior to arrival:  103 Temp source:  Oral Severity:  Moderate Onset quality:  Gradual Duration:  3 days Timing:  Constant Progression:  Worsening Chronicity:  New Relieved by:  Nothing Worsened by:  Nothing Ineffective treatments:  None tried Associated symptoms: sore throat   Risk factors: no sick contacts    Pt complains of a sore throat.  Pt reports he has had a fever.  He is on his 3rd day of zithromax.  Pt reports he is now getting white spots on the back of his throat. Pt has a histroy of frequent strep History reviewed. No pertinent past medical history.  There are no active problems to display for this patient.   History reviewed. No pertinent surgical history.     Home Medications    Prior to Admission medications   Medication Sig Start Date End Date Taking? Authorizing Provider  cefdinir (OMNICEF) 300 MG capsule Take 1 capsule (300 mg total) by mouth 2 (two) times daily. 11/07/18   Elson Areas, PA-C  fluconazole (DIFLUCAN) 200 MG tablet Take 1 tablet (200 mg total) by mouth daily for 7 days. 11/07/18 11/14/18  Elson Areas, PA-C  Multiple Vitamins-Minerals (MULTIVITAMIN PO) Take by mouth.    [provider]    Family History No family history on file.  Social History Social History   Tobacco Use  . Smoking status: Never Smoker  . Smokeless tobacco: Never Used  Substance Use Topics  . Alcohol use: No  . Drug use: No     Allergies   Amoxicillin   Review of Systems Review of Systems  Constitutional: Positive for fever.  HENT: Positive for sore throat.   All other systems reviewed and are negative.    Physical Exam Triage Vital  Signs ED Triage Vitals  Enc Vitals Group     BP 11/07/18 1337 111/63     Pulse Rate 11/07/18 1337 (!) 106     Resp --      Temp 11/07/18 1337 (!) 103.3 F (39.6 C)     Temp Source 11/07/18 1337 Oral     SpO2 11/07/18 1337 98 %     Weight --      Height --      Head Circumference --      Peak Flow --      Pain Score 11/07/18 1338 0     Pain Loc --      Pain Edu? --      Excl. in GC? --    No data found.  Updated Vital Signs BP 111/63 (BP Location: Left Arm)   Pulse (!) 106   Temp (!) 103.3 F (39.6 C) (Oral)   SpO2 98%   Visual Acuity Right Eye Distance:   Left Eye Distance:   Bilateral Distance:    Right Eye Near:   Left Eye Near:    Bilateral Near:     Physical Exam Vitals signs and nursing note reviewed.  Constitutional:      Appearance: He is well-developed.  HENT:     Head: Normocephalic.     Right Ear: Tympanic membrane normal.  Left Ear: Tympanic membrane normal.     Nose: Nose normal.     Mouth/Throat:     Comments: Erythema, white small spots tongue and throat Eyes:     Pupils: Pupils are equal, round, and reactive to light.  Neck:     Musculoskeletal: Normal range of motion.  Cardiovascular:     Rate and Rhythm: Normal rate.  Pulmonary:     Effort: Pulmonary effort is normal.  Abdominal:     General: There is no distension.  Musculoskeletal: Normal range of motion.  Neurological:     Mental Status: He is alert and oriented to person, place, and time.      UC Treatments / Results  Labs (all labs ordered are listed, but only abnormal results are displayed) Labs Reviewed  CULTURE, GROUP A STREP [facility])  POCT RAPID STREP A (OFFICE)   MDM  Strep is negative, however pt is on zithromax.  Throat looks like thrush.  Pt given rx for Omnicef.  I will also treat for thrush.  Pt given card for primary care.  EKG None  Radiology No results found.  Procedures Procedures (including critical care time)  Medications Ordered in  UC Medications  acetaminophen (TYLENOL) tablet 650 mg (650 mg Oral Given 11/07/18 1342)    Initial Impression / Assessment and Plan / UC Course  I have reviewed the triage vital signs and the nursing notes.  Pertinent labs & imaging results that were available during my care of the patient were reviewed by me and considered in my medical decision making (see chart for details).      Final Clinical Impressions(s) / UC Diagnoses   Final diagnoses:  Acute pharyngitis, unspecified etiology  Thrush     Discharge Instructions     Return if any problems.  See your Physician for recheck in 1 week   ED Prescriptions    Medication Sig Dispense Auth. Provider   fluconazole (DIFLUCAN) 200 MG tablet Take 1 tablet (200 mg total) by mouth daily for 7 days. 7 tablet Paulanthony Gleaves K, [place]   cefdinir (OMNICEF) 300 MG capsule Take 1 capsule (300 mg total) by mouth 2 (two) times daily. 30 capsule Elson Areas, [place]     Controlled Substance Prescriptions [place] Controlled Substance Registry consulted? Not Applicable   Elson Areas, [place] 11/07/18 [id number]

## 2018-11-10 LAB — CULTURE, GROUP A STREP (THRC)

## 2018-11-11 ENCOUNTER — Ambulatory Visit
Admission: EM | Admit: 2018-11-11 | Discharge: 2018-11-11 | Disposition: A | Discharge status: Home / Self Care | Payer: BLUE CROSS/BLUE SHIELD | Attending: Physician Assistant | Admitting: Physician Assistant

## 2018-11-11 ENCOUNTER — Ambulatory Visit: Payer: BLUE CROSS/BLUE SHIELD

## 2018-11-11 DIAGNOSIS — R05 Cough: Secondary | ICD-10-CM | POA: Diagnosis not present

## 2018-11-11 DIAGNOSIS — J209 Acute bronchitis, unspecified: Secondary | ICD-10-CM | POA: Diagnosis not present

## 2018-11-11 MED ORDER — PREDNISONE 50 MG PO TABS: 50.0000 mg | ORAL_TABLET | Freq: Every day | ORAL | 0 refills | Status: DC

## 2018-11-11 NOTE — ED Triage Notes (Signed)
Pt states seen here last Thursday and dx with thrush. States sore throat is better but now having diarrhea from the antibotics. Pt c/o dry cough with chest congestion x3days

## 2018-11-11 NOTE — Discharge Instructions (Addendum)
Continue antibiotics. Pick up a probiotic supplement

## 2018-11-13 NOTE — ED Provider Notes (Signed)
[name]    CSN: [id number] Arrival date & time: 11/11/18  1323     History   Chief Complaint Chief Complaint  Patient presents with  . Cough    HPI Benjamin Porter is a 38 y.o. male.   The history is provided by the patient. No language interpreter was used.  Cough  Cough characteristics:  Non-productive Sputum characteristics:  Nondescript Severity:  Moderate Onset quality:  Gradual Timing:  Constant Progression:  Worsening Chronicity:  New Smoker: no   Relieved by:  Nothing Worsened by:  Nothing Ineffective treatments:  None tried Associated symptoms: sore throat   Pt seem by me last week.  Thrush after zithromax.  Swollen tonsils.  Pt reports throat is better.  Pt reports he is now coughing/  Pt denies any hiv risk. Pt healthy, no cancer history  History reviewed. No pertinent past medical history.  There are no active problems to display for this patient.   History reviewed. No pertinent surgical history.     Home Medications    Prior to Admission medications   Medication Sig Start Date End Date Taking? Authorizing Provider  cefdinir (OMNICEF) 300 MG capsule Take 1 capsule (300 mg total) by mouth 2 (two) times daily. 11/07/18   Elson AreasSofia, Libia Fazzini K, PA-C  fluconazole (DIFLUCAN) 200 MG tablet Take 1 tablet (200 mg total) by mouth daily for 7 days. 11/07/18 11/14/18  Elson AreasSofia, Gracie Gupta K, PA-C  Multiple Vitamins-Minerals (MULTIVITAMIN PO) Take by mouth.    [provider]  predniSONE (DELTASONE) 50 MG tablet Take 1 tablet (50 mg total) by mouth daily. 11/11/18   Elson AreasSofia, Boysie Bonebrake K, PA-C    Family History No family history on file.  Social History Social History   Tobacco Use  . Smoking status: Never Smoker  . Smokeless tobacco: Never Used  Substance Use Topics  . Alcohol use: No  . Drug use: No     Allergies   Amoxicillin   Review of Systems Review of Systems  HENT: Positive for sore throat.   Respiratory: Positive for cough.     All other systems reviewed and are negative.    Physical Exam Triage Vital Signs ED Triage Vitals  Enc Vitals Group     BP 11/11/18 1434 133/85     Pulse Rate 11/11/18 1434 74     Resp 11/11/18 1434 18     Temp 11/11/18 1434 98.3 F (36.8 C)     Temp Source 11/11/18 1434 Oral     SpO2 11/11/18 1434 98 %     Weight --      Height --      Head Circumference --      Peak Flow --      Pain Score 11/11/18 1435 0     Pain Loc --      Pain Edu? --      Excl. in GC? --    No data found.  Updated Vital Signs BP 133/85 (BP Location: Left Arm)   Pulse 74   Temp 98.3 F (36.8 C) (Oral)   Resp 18   SpO2 98%   Visual Acuity Right Eye Distance:   Left Eye Distance:   Bilateral Distance:    Right Eye Near:   Left Eye Near:    Bilateral Near:     Physical Exam Vitals signs and nursing note reviewed.  Constitutional:      Appearance: He is well-developed and normal weight.  HENT:     Head: Normocephalic  and atraumatic.     Nose: Nose normal.     Mouth/Throat:     Mouth: Mucous membranes are moist.  Eyes:     Conjunctiva/sclera: Conjunctivae normal.  Neck:     Musculoskeletal: Neck supple.  Cardiovascular:     Rate and Rhythm: Normal rate and regular rhythm.     Heart sounds: No murmur.  Pulmonary:     Effort: Pulmonary effort is normal. No respiratory distress.     Breath sounds: Normal breath sounds.  Abdominal:     Palpations: Abdomen is soft.     Tenderness: There is no abdominal tenderness.  Musculoskeletal: Normal range of motion.  Skin:    General: Skin is warm and dry.  Neurological:     General: No focal deficit present.     Mental Status: He is alert.  Psychiatric:        Mood and Affect: Mood normal.      UC Treatments / Results  Labs (all labs ordered are listed, but only abnormal results are displayed) Labs Reviewed - No data to display  EKG None  Radiology Dg Chest 2 View  Result Date: 11/11/2018 CLINICAL DATA:  Dry cough on and off  for 2 weeks. EXAM: CHEST - 2 VIEW COMPARISON:  12/18/2015 FINDINGS: The heart size and mediastinal contours are within normal limits. Both lungs are clear. The visualized skeletal structures are unremarkable. IMPRESSION: No active cardiopulmonary disease. Electronically Signed   By: Tollie Ethavid  Kwon M.D.   On: 11/11/2018 15:12    Procedures Procedures (including critical care time)  Medications Ordered in UC Medications - No data to display  Initial Impression / Assessment and Plan / UC Course  I have reviewed the triage vital signs and the nursing notes.  Pertinent labs & imaging results that were available during my care of the patient were reviewed by me and considered in my medical decision making (see chart for details).     MDM  Chest xray reviewed and discussed with pt.  Pt advised to start a probiotic as he has had some diarrhea.  Finish antibiotic.  Pt given rx for prednisone  Final Clinical Impressions(s) / UC Diagnoses   Final diagnoses:  Acute bronchitis, unspecified organism     Discharge Instructions     Continue antibiotics. Pick up a probiotic supplement    ED Prescriptions    Medication Sig Dispense Auth. Provider   predniSONE (DELTASONE) 50 MG tablet Take 1 tablet (50 mg total) by mouth daily. 5 tablet Elson AreasSofia, Wissam Resor K, [place]     Controlled Substance Prescriptions [place] Controlled Substance Registry consulted? Not Applicable   Elson AreasSofia, [place] K, [place] 11/13/18 [id number]

## 2019-11-22 ENCOUNTER — Encounter: Payer: Self-pay | Admitting: Emergency Medicine

## 2019-11-22 ENCOUNTER — Other Ambulatory Visit: Payer: Self-pay

## 2019-11-22 ENCOUNTER — Ambulatory Visit
Admission: EM | Admit: 2019-11-22 | Discharge: 2019-11-22 | Disposition: A | Discharge status: Home / Self Care | Payer: BC Managed Care – PPO | Attending: Emergency Medicine | Admitting: Emergency Medicine

## 2019-11-22 DIAGNOSIS — R002 Palpitations: Secondary | ICD-10-CM | POA: Diagnosis not present

## 2019-11-22 DIAGNOSIS — F439 Reaction to severe stress, unspecified: Secondary | ICD-10-CM

## 2019-11-22 NOTE — ED Provider Notes (Signed)
[name]    CSN: [id number] Arrival date & time: 11/22/19  1123      History   Chief Complaint Chief Complaint  Patient presents with  . Palpitations    HPI Benjamin Porter is a 39 y.o. male without significant medical history presenting for evaluation of palpitations.  States he has had increased episodes of heart fluttering sensation over the last 3 days: States he will have maybe a few times in a 1 to 2-hour period.  Patient states that he will feel out of sync, little anxious, and deep breathing helps with this sensation.  States sometimes he feels the need to cough when this happens: Denies history of heartburn, reflux.  Denies lightheadedness, nausea, vomiting, syncopal event.  Denies history of thyroid or cardiopulmonary disorders.  No change in hair, skin, nails, thirst, urination.  States first time this ever happened was about 2 weeks ago without inciting event.  Patient states that he talked with a coworker who said "it might be stress ".  Patient denies history of depression, anxiety, the states he has been under more stress than usual.  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  States he is also been working with his wife, who has fibromyalgia, to buy their first home.  Patient denies sleep disruption, SI, HI.  Has not taken anything for symptoms.   History reviewed. No pertinent past medical history.  There are no problems to display for this patient.   History reviewed. No pertinent surgical history.     Home Medications    Prior to Admission medications   Not on File    Family History Family History  Problem Relation Age of Onset  . Thyroid disease Mother   . Healthy Father     Social History Social History   Tobacco Use  . Smoking status: Never Smoker  . Smokeless tobacco: Never Used  Substance Use Topics  . Alcohol use: No  . Drug use: No     Allergies   Amoxicillin   Review of Systems As  per HPI   Physical Exam Triage Vital Signs ED Triage Vitals  Enc Vitals Group     BP      Pulse      Resp      Temp      Temp src      SpO2      Weight      Height      Head Circumference      Peak Flow      Pain Score      Pain Loc      Pain Edu?      Excl. in GC?    No data found.  Updated Vital Signs BP (!) 151/91 (BP Location: Left Arm)   Pulse 93   Temp 97.8 F (36.6 C) (Temporal)   Resp 18   SpO2 97%   Visual Acuity Right Eye Distance:   Left Eye Distance:   Bilateral Distance:    Right Eye Near:   Left Eye Near:    Bilateral Near:     Physical Exam Constitutional:      General: He is not in acute distress.    Appearance: He is not ill-appearing, toxic-appearing or diaphoretic.  HENT:     Head: Normocephalic and atraumatic.     Mouth/Throat:     Mouth: Mucous membranes are moist.     Pharynx: Oropharynx is clear.  Eyes:  General: No scleral icterus.    Conjunctiva/sclera: Conjunctivae normal.     Pupils: Pupils are equal, round, and reactive to light.  Neck:     Comments: Trachea midline, negative JVD.  No thyromegaly or nodules/masses appreciated. Cardiovascular:     Rate and Rhythm: Normal rate and regular rhythm.     Pulses: Normal pulses.     Heart sounds: No murmur. No friction rub. No gallop.   Pulmonary:     Effort: Pulmonary effort is normal. No respiratory distress.     Breath sounds: No stridor. No wheezing, rhonchi or rales.  Musculoskeletal:        General: Normal range of motion.     Cervical back: Neck supple. No tenderness.     Right lower leg: No edema.     Left lower leg: No edema.  Lymphadenopathy:     Cervical: No cervical adenopathy.  Skin:    Capillary Refill: Capillary refill takes less than 2 seconds.     Coloration: Skin is not jaundiced or pale.     Findings: No rash.  Neurological:     General: No focal deficit present.     Mental Status: He is alert and oriented to person, place, and time.      UC  Treatments / Results  Labs (all labs ordered are listed, but only abnormal results are displayed) Labs Reviewed - No data to display  EKG   Radiology No results found.  Procedures Procedures (including critical care time)  Medications Ordered in UC Medications - No data to display  Initial Impression / Assessment and Plan / UC Course  I have reviewed the triage vital signs and the nursing notes.  Pertinent labs & imaging results that were available during my care of the patient were reviewed by me and considered in my medical decision making (see chart for details).     Patient afebrile, nontoxic in office.  Not currently experiencing palpitations.  EKG done in office without previous to compare.  Reviewed by me: Normal sinus rhythm with ventricular rate of 68 bpm.  No QTC prolongation, ST elevation or depression.  Normal EKG.  Reviewed findings with patient who verbalized understanding.  Likely PVC versus stress/anxiety.  Patient to keep symptom log, follow-up with PCP.  Return precautions discussed, patient verbalized understanding and is agreeable to plan. Final Clinical Impressions(s) / UC Diagnoses   Final diagnoses:  Intermittent palpitations  Stress     Discharge Instructions     Keep symptom log! Use apps like Headspace and Calm Follow up with PCP. Go to ER for worsening symptoms, chest pain, nausea, passing out.    ED Prescriptions    None     PDMP not reviewed this encounter.   Hall-Potvin, [place], [place] 11/23/19 1614

## 2019-11-22 NOTE — ED Notes (Signed)
Patient able to ambulate independently  

## 2019-11-22 NOTE — Discharge Instructions (Addendum)
Keep symptom log! Use apps like Headspace and Calm Follow up with PCP. Go to ER for worsening symptoms, chest pain, nausea, passing out.

## 2019-11-22 NOTE — ED Triage Notes (Signed)
Pt presents to [facility] for assessment of 3 days of heart "fluttering".  States he feels like he has to take a deep breath right after it happens and it makes him feels like he wants to cough.  No cardiac hx that he knows off.  Patient c/o increased stress in the past few weeks.  C/o discomfort right after it happens that lasts a few seconds, but denies chest pain.  States he was taking his pulse during the sensation yesterday and every 16-20 beats he would experience a harder, "off beat".

## 2019-12-11 ENCOUNTER — Ambulatory Visit (INDEPENDENT_AMBULATORY_CARE_PROVIDER_SITE_OTHER): Payer: BC Managed Care – PPO | Admitting: Internal Medicine

## 2019-12-11 ENCOUNTER — Encounter: Payer: Self-pay | Admitting: Internal Medicine

## 2019-12-11 DIAGNOSIS — R002 Palpitations: Secondary | ICD-10-CM | POA: Diagnosis not present

## 2019-12-11 DIAGNOSIS — F411 Generalized anxiety disorder: Secondary | ICD-10-CM | POA: Diagnosis not present

## 2019-12-11 DIAGNOSIS — Z7689 Persons encountering health services in other specified circumstances: Secondary | ICD-10-CM

## 2019-12-11 NOTE — Progress Notes (Signed)
Virtual Visit via Telephone Note  I connected with Benjamin Porter, on 12/11/2019 at 10:32 AM by telephone due to the COVID-19 pandemic and verified that I am speaking with the correct person using two identifiers.   Consent: I discussed the limitations, risks, security and privacy concerns of performing an evaluation and management service by telephone and the availability of in person appointments. I also discussed with the patient that there may be a patient responsible charge related to this service. The patient expressed understanding and agreed to proceed.   Location of Patient: Work    Government social research officer of Provider: Home    Persons participating in Telemedicine visit: Alexzavier Girardin [facility] Dr. Earlene Plater      History of Present Illness: Patient has a visit to establish care. PMH none. Does not take any medications.   Patient went to urgent care for palpitations on 1/30. Had normal EKG at that time. Today, patient reports that he is 95% better. Has never previously had anxiety attack or what he felt was excessive stress. Since then, he changed his diet and habits. Cut out caffeine, alcohol, added salt, added sugar. Getting back to where he needs to be--states he had let himself "slip a little bit". Feels anxiety start to creep up every now and then. Able to calm himself down.   Went and bought a blood pressure cuff because it was high after his visit to urgent care. Now seeing numbers in 120s systolic.   Feels like his palpitations were all related to anxiety. Had never had any palpitations or rapid heart beat prior. Father did tell him he comes from a long line of heart flutters. No family history of HOCM, heart murmurs, valve defects, or significant cardiac disease.    No past medical history on file. Allergies  Allergen Reactions  . Amoxicillin Rash    No current outpatient medications on file prior to visit.   No current facility-administered medications on file prior  to visit.    Observations/Objective: NAD. Speaking clearly.  Work of breathing normal.  Alert and oriented. Mood appropriate.   Assessment and Plan: 1. Encounter to establish care   2. Palpitations Likely related to acute anxiety. Patient has limited caffeine and alcohol from diet, which may also be helping. Has resolved now. EKG reviewed and NSR. No significant family cardiac history. Will plan to obtain CBC and TSH when here for annual exam. Discussed that if returns and especially if not related to stress/anxiety, would consider Holter monitor.   3. Anxiety state Improved. Patient declines referral to LCSW at this time but reiterated that is available in the future if needed.    Follow Up Instructions: Follow up for annual exam    I discussed the assessment and treatment plan with the patient. The patient was provided an opportunity to ask questions and all were answered. The patient agreed with the plan and demonstrated an understanding of the instructions.   The patient was advised to call back or seek an in-person evaluation if the symptoms worsen or if the condition fails to improve as anticipated.     I provided 14 minutes total of non-face-to-face time during this encounter including median intraservice time, reviewing previous notes, investigations, ordering medications, medical decision making, coordinating care and patient verbalized understanding at the end of the visit.    Marcy Siren, D.O. Primary Care at [facility]  12/11/2019, 10:32 AM

## 2020-02-26 ENCOUNTER — Telehealth: Payer: Self-pay

## 2020-02-26 NOTE — Telephone Encounter (Signed)
Called patient to do their pre-visit COVID screening.  Have you tested positive for COVID or are you currently waiting for COVID test results? no  Have you recently traveled internationally([place], [place], [place], [place], [place]) or within the [place] to a hotspot area([place], [place], [place], [place], [place])? no  Are you currently experiencing any of the following symptoms: fever, cough, SHOB, fatigue, body aches, loss of smell/taste, rash, diarrhea, vomiting, severe headaches, weakness, sore throat? no  Have you been in contact with anyone who has recently travelled? no  Have you been in contact with anyone who is experiencing any of the above symptoms or been diagnosed with COVID  or works in or has recently visited a SNF? no  Asked patient to come fasting for CPE labs. 

## 2020-02-26 NOTE — Patient Instructions (Signed)

## 2020-02-27 ENCOUNTER — Ambulatory Visit (INDEPENDENT_AMBULATORY_CARE_PROVIDER_SITE_OTHER): Payer: BC Managed Care – PPO | Admitting: Internal Medicine

## 2020-02-27 ENCOUNTER — Other Ambulatory Visit: Payer: Self-pay

## 2020-02-27 ENCOUNTER — Encounter: Payer: Self-pay | Admitting: Internal Medicine

## 2020-02-27 VITALS — BP 129/83 | HR 74 | Temp 97.3°F | Resp 17 | Ht 69.0 in | Wt 172.0 lb

## 2020-02-27 DIAGNOSIS — R002 Palpitations: Secondary | ICD-10-CM | POA: Diagnosis not present

## 2020-02-27 DIAGNOSIS — Z114 Encounter for screening for human immunodeficiency virus [HIV]: Secondary | ICD-10-CM

## 2020-02-27 DIAGNOSIS — G8929 Other chronic pain: Secondary | ICD-10-CM

## 2020-02-27 DIAGNOSIS — Z0000 Encounter for general adult medical examination without abnormal findings: Secondary | ICD-10-CM | POA: Diagnosis not present

## 2020-02-27 DIAGNOSIS — M25511 Pain in right shoulder: Secondary | ICD-10-CM | POA: Diagnosis not present

## 2020-02-27 DIAGNOSIS — M25532 Pain in left wrist: Secondary | ICD-10-CM

## 2020-02-27 NOTE — Progress Notes (Signed)
Subjective:    Benjamin Porter - 39 y.o. male MRN [id number]  Date of birth: 05/25/1981  HPI  Benjamin Porter is here for annual exam.  Right Shoulder Pain: Started about 6 months ago after accident at work where a ladder fell and he caught it and slowly eased it down to the floor. Since this incident, he has had lateral sided shoulder pain. Initially, it was extremely sore and difficult to move his arm. Reports he had to assume almost a chicken wing like pose with the arm. He now has more ROM but still has discomfort with abduction and internal rotation. No weakness or loss of grip strength.   Left Wrist Pain: Feels like there is a small knot that comes and goes. Present on the palmar aspect of his wrist. When it increases in size or feels more inflamed, his wrist hurts more.     Health Maintenance:  Health Maintenance Due  Topic Date Due  . HIV Screening  Never done    -  reports that he has never smoked. He has never used smokeless tobacco. - Review of Systems: Per HPI. - Past Medical History: There are no problems to display for this patient.  - Medications: reviewed and updated   Objective:   Physical Exam BP 129/83   Pulse 74   Temp (!) 97.3 F (36.3 C) (Temporal)   Resp 17   Ht 5\' 9"  (1.753 m)   Wt 172 lb (78 kg)   SpO2 97%   BMI 25.40 kg/m  Physical Exam  Constitutional: He is oriented to person, place, and time and well-developed, well-nourished, and in no distress.  HENT:  Head: Normocephalic and atraumatic.  Mouth/Throat: Oropharynx is clear and moist.  TMs normal bilaterally   Eyes: Pupils are equal, round, and reactive to light. Conjunctivae and EOM are normal.  Neck: No thyromegaly present.  Cardiovascular: Normal rate, regular rhythm, normal heart sounds and intact distal pulses.  No murmur heard. Pulmonary/Chest: Effort normal and breath sounds normal. No respiratory distress. He has no wheezes.  Abdominal: Soft. Bowel sounds are normal. He exhibits no  distension. There is no abdominal tenderness. There is no rebound and no guarding.  Musculoskeletal:        General: No deformity or edema. Normal range of motion.     Cervical back: Normal range of motion and neck supple.     Comments: TTP over the lateral aspect of right shoulder. No bony deformity appreciated. Pain with abduction beyond 120 degrees and with internal ROM. ROM is preserved. Negative Hawkins and empty can test. Strength 5/5.   Lymphadenopathy:    He has no cervical adenopathy.  Neurological: He is alert and oriented to person, place, and time. Gait normal.  Skin: Skin is warm and dry. No rash noted. He is not diaphoretic.  Small nodule present over tendon on left wrist, palmar side.   Psychiatric: Mood, affect and judgment normal.        Assessment & Plan:   1. Annual physical exam Counseled on 150 minutes of exercise per week, healthy eating (including decreased daily intake of saturated fats, cholesterol, added sugars, sodium), STI prevention, routine healthcare maintenance. - CBC with Differential - Comprehensive metabolic panel - Lipid Panel  2. Screening for HIV (human immunodeficiency virus) - HIV antibody (with reflex)  3. Palpitations This has been addressed on prior phone visit and at Urgent Care. Likely related to panic attacks. Have essentially resolved but had discussed prior about checking some lab work  including TSH. Has previously normal EKG. If lab work benign, will not carry out further work up unless symptoms worsen or change.  - CBC with Differential - Comprehensive metabolic panel - TSH  4. Chronic right shoulder pain Likely related to rotator cuff tendinopathy given nature and chronicity of pain with history of injury. Doubt full thickness tear given ROM and strength preserved. Will refer to PT.  - Ambulatory referral to Physical Therapy  5. Left wrist pain Appears to have a small ganglion cyst that is likely causing some compression. Symptoms  are not severe nor constant so will continue to monitor without intervention at present.      Marcy Siren, D.O. 02/27/2020, 8:43 AM Primary Care at [facility]

## 2020-02-28 LAB — LIPID PANEL
Chol/HDL Ratio: 5.7 ratio — ABNORMAL HIGH (ref 0.0–5.0)
Cholesterol, Total: 228 mg/dL — ABNORMAL HIGH (ref 100–199)
HDL: 40 mg/dL (ref >39)
LDL Chol Calc (NIH): 167 mg/dL — ABNORMAL HIGH (ref 0–99)
Triglycerides: 114 mg/dL (ref 0–149)
VLDL Cholesterol Cal: 21 mg/dL (ref 5–40)

## 2020-02-28 LAB — COMPREHENSIVE METABOLIC PANEL
ALT: 17 IU/L (ref 0–44)
AST: 22 IU/L (ref 0–40)
Albumin/Globulin Ratio: 2.1 (ref 1.2–2.2)
Albumin: 4.6 g/dL (ref 4.0–5.0)
Alkaline Phosphatase: 76 IU/L (ref 39–117)
BUN/Creatinine Ratio: 12 (ref 9–20)
BUN: 11 mg/dL (ref 6–20)
Bilirubin Total: 0.7 mg/dL (ref 0.0–1.2)
CO2: 22 mmol/L (ref 20–29)
Calcium: 9.5 mg/dL (ref 8.7–10.2)
Chloride: 103 mmol/L (ref 96–106)
Creatinine, Ser: 0.92 mg/dL (ref 0.76–1.27)
GFR calc Af Amer: 122 mL/min/{1.73_m2} (ref >59)
GFR calc non Af Amer: 106 mL/min/{1.73_m2} (ref >59)
Globulin, Total: 2.2 g/dL (ref 1.5–4.5)
Glucose: 91 mg/dL (ref 65–99)
Potassium: 4.4 mmol/L (ref 3.5–5.2)
Sodium: 141 mmol/L (ref 134–144)
Total Protein: 6.8 g/dL (ref 6.0–8.5)

## 2020-02-28 LAB — CBC WITH DIFFERENTIAL/PLATELET
Basophils Absolute: 0 10*3/uL (ref 0.0–0.2)
Basos: 0 %
EOS (ABSOLUTE): 0.1 10*3/uL (ref 0.0–0.4)
Eos: 1 %
Hematocrit: 47.7 % (ref 37.5–51.0)
Hemoglobin: 15.9 g/dL (ref 13.0–17.7)
Immature Grans (Abs): 0 10*3/uL (ref 0.0–0.1)
Immature Granulocytes: 1 %
Lymphocytes Absolute: 1.9 10*3/uL (ref 0.7–3.1)
Lymphs: 31 %
MCH: 29.4 pg (ref 26.6–33.0)
MCHC: 33.3 g/dL (ref 31.5–35.7)
MCV: 88 fL (ref 79–97)
Monocytes Absolute: 0.5 10*3/uL (ref 0.1–0.9)
Monocytes: 8 %
Neutrophils Absolute: 3.6 10*3/uL (ref 1.4–7.0)
Neutrophils: 59 %
Platelets: 218 10*3/uL (ref 150–450)
RBC: 5.4 x10E6/uL (ref 4.14–5.80)
RDW: 12.8 % (ref 11.6–15.4)
WBC: 6 10*3/uL (ref 3.4–10.8)

## 2020-02-28 LAB — TSH: TSH: 1.3 u[IU]/mL (ref 0.450–4.500)

## 2020-02-28 LAB — HIV ANTIBODY (ROUTINE TESTING W REFLEX): HIV Screen 4th Generation wRfx: NONREACTIVE

## 2020-03-02 ENCOUNTER — Encounter: Payer: Self-pay | Admitting: Internal Medicine

## 2020-03-02 DIAGNOSIS — E785 Hyperlipidemia, unspecified: Secondary | ICD-10-CM | POA: Insufficient documentation

## 2020-03-03 NOTE — Progress Notes (Signed)
Patient notified of results & recommendations. Expressed understanding.

## 2020-03-16 ENCOUNTER — Encounter: Payer: Self-pay | Admitting: Physical Therapy

## 2020-03-16 ENCOUNTER — Ambulatory Visit: Payer: BC Managed Care – PPO | Attending: Internal Medicine | Admitting: Physical Therapy

## 2020-03-16 ENCOUNTER — Other Ambulatory Visit: Payer: Self-pay

## 2020-03-16 DIAGNOSIS — G8929 Other chronic pain: Secondary | ICD-10-CM | POA: Diagnosis not present

## 2020-03-16 DIAGNOSIS — M25511 Pain in right shoulder: Secondary | ICD-10-CM | POA: Insufficient documentation

## 2020-03-16 NOTE — Therapy (Addendum)
[place], [place], [address] Phone: [phone]   Fax:  [phone]  Physical Therapy Evaluation / Discharge  Patient Details  Name: Benjamin Porter MRN: [id number] Date of Birth: [birth year] Referring Provider (PT): Nicolette Bang, DO    Encounter Date: 03/16/2020  PT End of Session - 03/16/20 0820    Visit Number  1    Number of Visits  7    Date for PT Re-Evaluation  04/27/20    PT Start Time  0822    PT Stop Time  0910    PT Time Calculation (min)  48 min    Activity Tolerance  Patient tolerated treatment well    Behavior During Therapy  [facility] for tasks assessed/performed       History reviewed. No pertinent past medical history.  History reviewed. No pertinent surgical history.  There were no vitals filed for this visit.   Subjective Assessment - 03/16/20 0829    Subjective  pt is a 39 y.o M with CC of R shoulder pain that started back in Novemeber 2020 while he was a to work at [employer] and was pulling the extension and ladder and the bottome slipped and he held onto and controlled it down. He reported having pain following injury. He rpeorts the pain has gotten better but still hurts when he gets to a certain range. He denies any N/T, and reports no hx or R shoulder.    How long can you sit comfortably?  unlimited    How long can you stand comfortably?  unlimited    How long can you walk comfortably?  unlimited    Diagnostic tests  N/A    Currently in Pain?  Yes    Pain Score  0-No pain   3   Pain Orientation  Right    Pain Descriptors / Indicators  Tightness;Aching   tugging   Pain Type  Chronic pain    Pain Onset  More than a month ago    Pain Frequency  Intermittent    Aggravating Factors   rasing the arm overhead, and placing hands behind the head.    Pain Relieving Factors  removing it out of aggrivating position,         [facility] PT Assessment - 03/16/20 0001       Assessment   Medical Diagnosis  R shoulder pain    Referring Provider (PT)  Nicolette Bang, DO     Onset Date/Surgical Date  --   November 2020   Hand Dominance  Right    Next MD Visit  1 year     Prior Therapy  no      Precautions   Precautions  None      Restrictions   Weight Bearing Restrictions  No      Balance Screen   Has the patient fallen in the past 6 months  No      [place] residence      Prior Function   Level of Independence  Independent    Vocation  Full time employment    Vocation Requirements  lifting/ carrying / pushing pulling,     Leisure  grilling, cooking, video games      Cognition   Overall Cognitive Status  Within Functional Limits for tasks assessed      Observation/Other Assessments   Focus on Therapeutic Outcomes (FOTO)   26% limited    predicted  18% limitation     Posture/Postural Control   Posture/Postural Control  No significant limitations      ROM / Strength   AROM / PROM / Strength  AROM;Strength      AROM   Overall AROM   Within functional limits for tasks performed    AROM Assessment Site  Shoulder      Strength   Strength Assessment Site  Shoulder;Hand    Right/Left Shoulder  Right;Left    Right Shoulder Flexion  5/5    Right Shoulder Extension  5/5    Right Shoulder ABduction  5/5   tightness during testing   Right Shoulder Internal Rotation  5/5    Left Shoulder Flexion  5/5    Left Shoulder Extension  5/5    Left Shoulder ABduction  5/5    Left Shoulder Internal Rotation  5/5    Left Shoulder External Rotation  5/5    Right Hand Grip (lbs)  123.3   410,301,314   Left Hand Grip (lbs)  108.3   107,108,110     Palpation   Palpation comment  TTP along the middle/ posterior deltoid with multiple trigger points, and sub-acromial space      Special Tests    Special Tests  Rotator Cuff Impingement    Rotator Cuff Impingment tests  Neer impingement test;Hawkins- Kennedy  test;Painful Arc of Motion;Empty Can test;Full Can test;other      Neer Impingement test    Comments  scpaular assist testing      Empty Can test   Findings  Positive      Full Can test   Findings  Negative      Painful Arc of Motion   Findings  Negative                  Objective measurements completed on examination: See above findings.      [facility] Adult PT Treatment/Exercise - 03/16/20 0001      Exercises   Exercises  Shoulder      Shoulder Exercises: Standing   External Rotation  Strengthening;Right;12 reps;Theraband    Theraband Level (Shoulder External Rotation)  Level 4 (Blue)    Internal Rotation  Strengthening;Right;Theraband;12 reps    Theraband Level (Shoulder Internal Rotation)  Level 4 (Blue)    Other Standing Exercises  lower trap wall Y's 1 x 10    tactile cues for proper form   Other Standing Exercises  wall push up with a plus 1 x 8      Shoulder Exercises: Stretch   Other Shoulder Stretches  upper trap 1 x 30 sec R              PT Education - 03/16/20 0837    Education Details  evaluation findings, POC, goals, HEP with proper form/ rationale. biomechanics of humerus and the scapula.    Person(s) Educated  Patient    Methods  Explanation;Verbal cues    Comprehension  Verbalized understanding;Verbal cues required       PT Short Term Goals - 03/16/20 0923      PT SHORT TERM GOAL #1   Title  pt to be I with inital HEP    Time  3    Period  Weeks    Status  New    Target Date  04/06/20        PT Long Term Goals - 03/16/20 0924      PT LONG TERM GOAL #1   Title  pt  to maintain functional shoulder ROM with no report of pain or limitations with overhead or behind the back reaching    Time  6    Period  Weeks    Status  New    Target Date  04/27/20      PT LONG TERM GOAL #2   Title  pt to be able to return to personal lifting / exercise per pt's personal goals with no limtations    Time  6    Period  Weeks    Status  New     Target Date  04/27/20      PT LONG TERM GOAL #3   Title  increase FOTO score to </= 18% limited to demo improvement in function    Time  6    Period  Weeks    Status  New    Target Date  04/27/20      PT LONG TERM GOAL #4   Title  pt to be I with all HEP given to maintain and progress current level of function    Time  6    Period  Weeks    Status  New    Target Date  04/27/20             Plan - 03/16/20 2620    Clinical Impression Statement  pt presents to OPPT with CC of R shoulder pain strating back in November when he held onto an exension ladder and lowered to the ground unexpectedly. He demosntrates functional shoulder ROM and strength but does report pain with resisted shoulder abduction. TTP along the middle/posterior deltoid and sub-acromial space. He would benefit from physical therapy to decrease shoulder pain, improve scapulohumeral biomechanics and return to PLOF by addressing the deficits listed.    Stability/Clinical Decision Making  Stable/Uncomplicated    Clinical Decision Making  Low    Rehab Potential  Good    PT Frequency  1x / week    PT Duration  6 weeks    PT Treatment/Interventions  ADLs/Self Care Home Management;Cryotherapy;Electrical Stimulation;Iontophoresis 7m/ml Dexamethasone;Moist Heat;Ultrasound;Therapeutic activities;Therapeutic exercise;Patient/family education;Neuromuscular re-education;Balance training;Manual techniques;Passive range of motion;Dry needling;Taping    PT Next Visit Plan  review/ update HEP, provided pt FOTO handout, discuss DN for middle / posterior deltoid, STW PRN, scapular stability strengthening, functional lifting/ carrying    PT Home Exercise Plan  [id number]- upper trap stretch, shoulder IR/ER, lower trap wall y's, wall push up with plus, posterior capsule stretch    Consulted and Agree with Plan of Care  Patient       Patient will benefit from skilled therapeutic intervention in order to improve the following deficits  and impairments:  Improper body mechanics, Pain, Decreased activity tolerance, Postural dysfunction  Visit Diagnosis: Chronic right shoulder pain - Plan: PT plan of care cert/re-cert     Problem List Patient Active Problem List   Diagnosis Date Noted  . Hyperlipidemia 03/02/2020   KStarr LakePT, DPT, LAT, ATC  03/16/20  10:18 AM      [facility] [address]Phone: [phone]  Fax:  [phone] Name: CCable FearnMRN: [id number] of Birth: 3April 12, 1982     PHYSICAL THERAPY DISCHARGE SUMMARY  Visits from Start of Care: 1  Current functional level related to goals / functional outcomes: See goals   Remaining deficits: Pt called and cancelled all visits and self discharged reporting he was doing better.   Education /  Equipment: HEP, theraband, posture  Plan: Patient agrees to discharge.  Patient goals were not met. Patient is being discharged due to the patient's request.  ?????         Detavious Rinn PT, DPT, LAT, ATC  04/06/20  1:15 PM

## 2020-03-31 ENCOUNTER — Ambulatory Visit: Payer: BC Managed Care – PPO | Admitting: Physical Therapy

## 2020-04-07 ENCOUNTER — Ambulatory Visit: Payer: BC Managed Care – PPO | Admitting: Physical Therapy

## 2020-04-14 ENCOUNTER — Ambulatory Visit: Payer: BC Managed Care – PPO | Admitting: Physical Therapy

## 2020-04-21 ENCOUNTER — Encounter: Payer: BC Managed Care – PPO | Admitting: Physical Therapy

## 2020-06-18 IMAGING — DX DG CHEST 2V
2 series · 2 of 2 positions shown · non-contrast
Comparison: 12/18/2015

CLINICAL DATA: Dry cough on and off for 2 weeks.

EXAM:
CHEST - 2 VIEW

[chest pa]
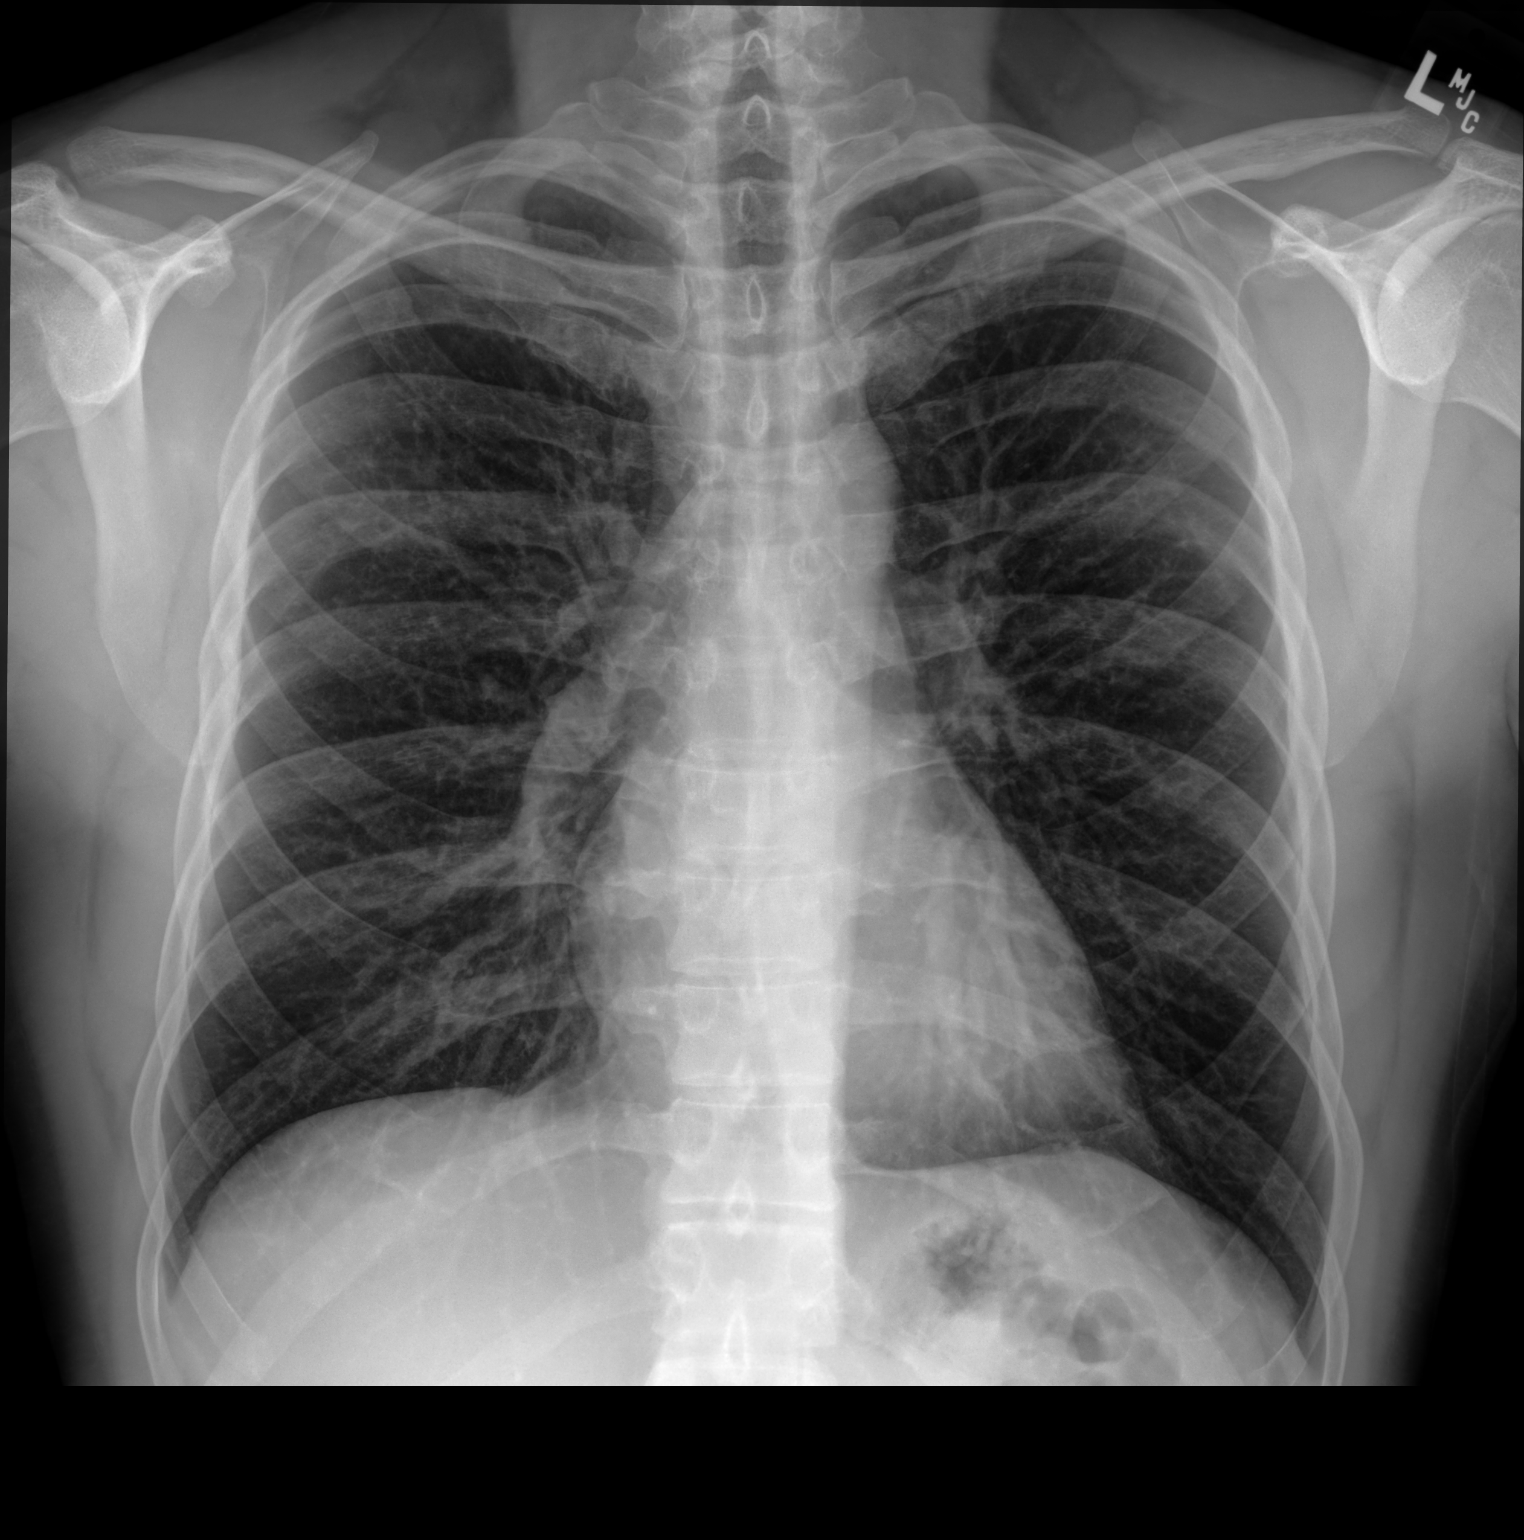

[chest lat]
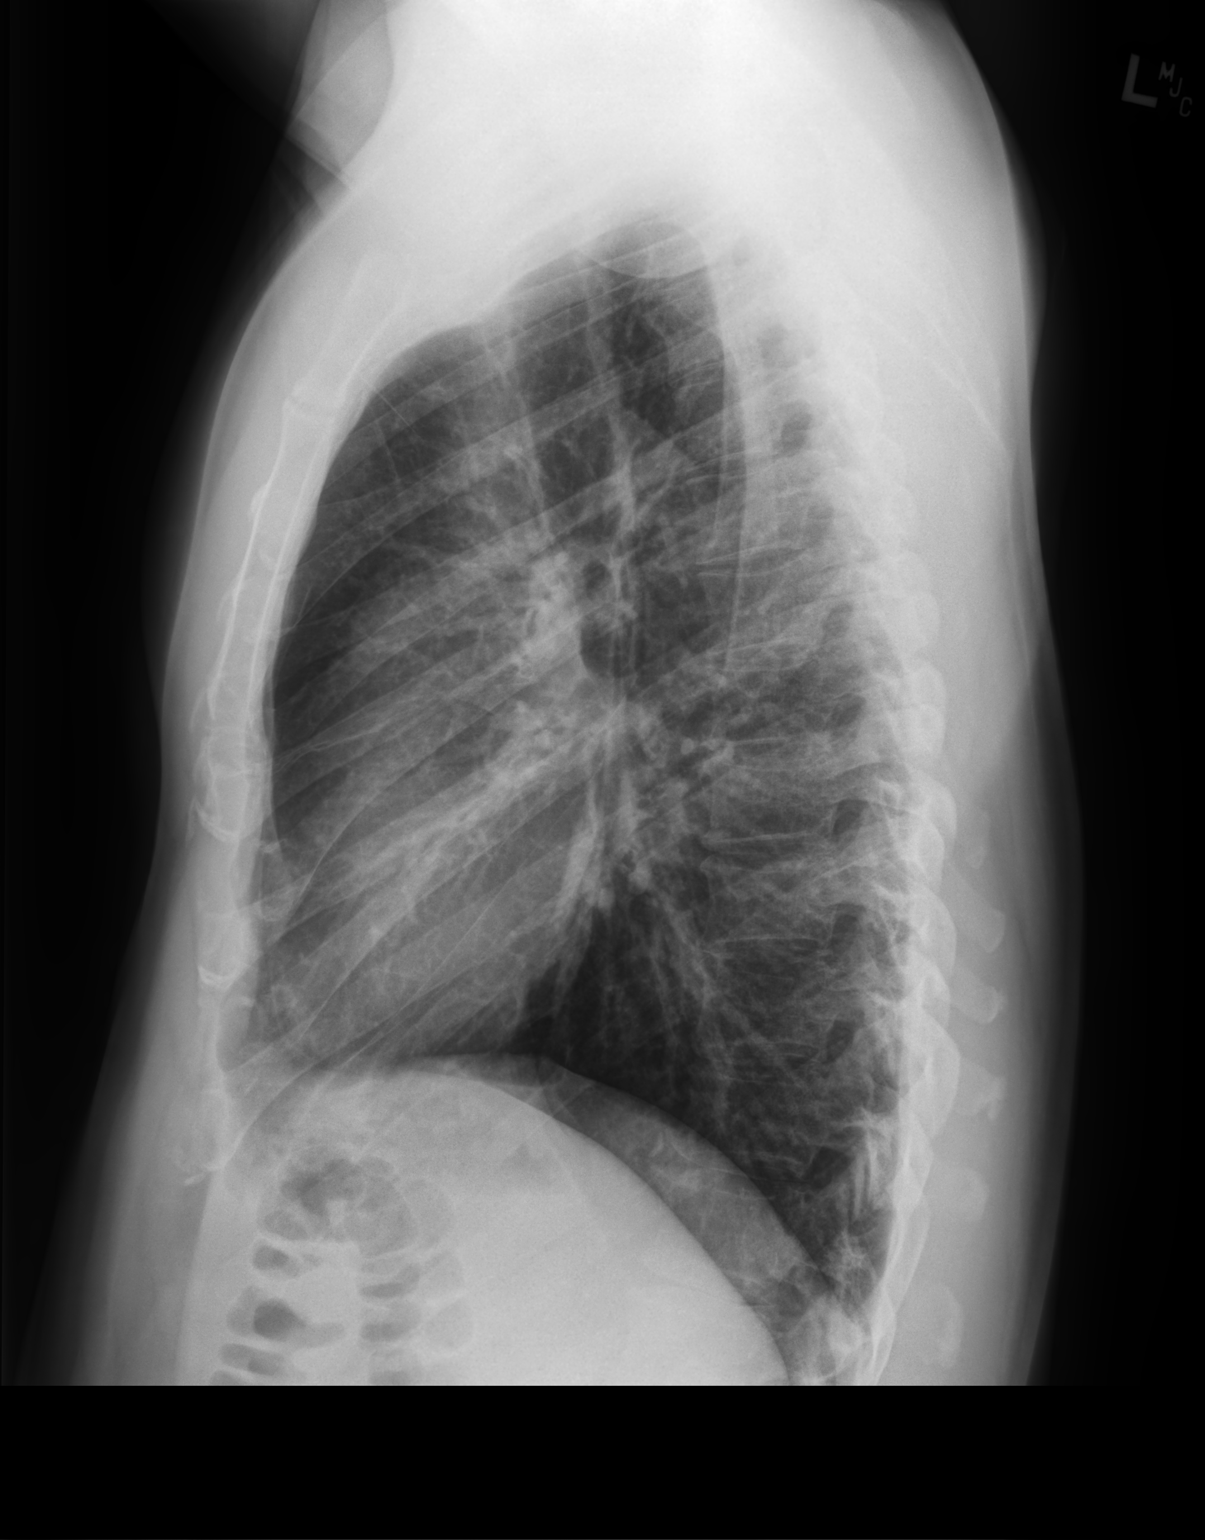

[2 of 2 positions shown; findings below may reference images not displayed]

FINDINGS: The heart size and mediastinal contours are within normal limits.
Both lungs are clear. The visualized skeletal structures are
unremarkable.
IMPRESSION: No active cardiopulmonary disease.

## 2020-06-23 DIAGNOSIS — M542 Cervicalgia: Secondary | ICD-10-CM | POA: Diagnosis not present

## 2020-07-08 DIAGNOSIS — M542 Cervicalgia: Secondary | ICD-10-CM | POA: Diagnosis not present

## 2021-09-24 NOTE — Progress Notes (Signed)
Patient ID: Benjamin Porter, male    DOB: 07/11/1981  MRN: [id number]  CC: Annual Physical Exam  Subjective: Benjamin Porter is a 40 y.o. male who presents for annual physical exam.  His concerns today include:   Requesting information regarding vasectomy.    Patient Active Problem List   Diagnosis Date Noted   Hyperlipidemia 03/02/2020     Current Outpatient Medications on File Prior to Visit  Medication Sig Dispense Refill   Multiple Vitamin (MULTIVITAMIN) tablet Take 1 tablet by mouth daily.     No current facility-administered medications on file prior to visit.    Allergies  Allergen Reactions   Amoxicillin Rash    Social History   Socioeconomic History   Marital status: Married    Spouse name: Not on file   Number of children: Not on file   Years of education: Not on file   Highest education level: Not on file  Occupational History   Not on file  Tobacco Use   Smoking status: Never   Smokeless tobacco: Never  Substance and Sexual Activity   Alcohol use: No   Drug use: No   Sexual activity: Not on file  Other Topics Concern   Not on file  Social History Narrative   Not on file   Social Determinants of Health   Financial Resource Strain: Not on file  Food Insecurity: Not on file  Transportation Needs: Not on file  Physical Activity: Not on file  Stress: Not on file  Social Connections: Not on file  Intimate Partner Violence: Not on file    Family History  Problem Relation Age of Onset   Thyroid disease Mother    Healthy Father     No past surgical history on file.  ROS: Review of Systems Negative except as stated above  PHYSICAL EXAM: BP 132/86 (BP Location: Left Arm, Patient Position: Sitting, Cuff Size: Normal)   Pulse 78   Temp 98.3 F (36.8 C)   Resp 18   Ht 5' 8.03" (1.728 m)   Wt 171 lb 9.6 oz (77.8 kg)   SpO2 98%   BMI 26.07 kg/m   Physical Exam HENT:     Head: Normocephalic and atraumatic.     Right Ear: Tympanic  membrane, ear canal and external ear normal.     Left Ear: Tympanic membrane, ear canal and external ear normal.  Eyes:     Extraocular Movements: Extraocular movements intact.     Conjunctiva/sclera: Conjunctivae normal.     Pupils: Pupils are equal, round, and reactive to light.  Cardiovascular:     Rate and Rhythm: Normal rate and regular rhythm.     Pulses: Normal pulses.     Heart sounds: Normal heart sounds.  Pulmonary:     Effort: Pulmonary effort is normal.     Breath sounds: Normal breath sounds.  Abdominal:     General: Bowel sounds are normal.     Palpations: Abdomen is soft.  Genitourinary:    Comments: Patient declined exam.  Musculoskeletal:        General: Normal range of motion.     Cervical back: Normal range of motion and neck supple.  Skin:    General: Skin is warm and dry.     Capillary Refill: Capillary refill takes less than 2 seconds.     Coloration: Skin is not jaundiced.  Neurological:     General: No focal deficit present.     Mental Status: He is alert and  oriented to person, place, and time.  Psychiatric:        Mood and Affect: Mood normal.        Behavior: Behavior normal.    ASSESSMENT AND PLAN: 1. Annual physical exam: - Counseled on 150 minutes of exercise per week as tolerated, healthy eating (including decreased daily intake of saturated fats, cholesterol, added sugars, sodium), STI prevention, and routine healthcare maintenance.  2. Screening for metabolic disorder: - HXT05+WPVX to check kidney function, liver function, and electrolyte balance.  - CMP14+EGFR  3. Screening for deficiency anemia: - CBC to screen for anemia. - CBC  4. Diabetes mellitus screening: - Hemoglobin A1c to screen for pre-diabetes/diabetes. - Hemoglobin A1c  5. Screening cholesterol level: - Lipid panel to screen for high cholesterol.  - Lipid panel  6. Thyroid disorder screen: - TSH to check thyroid function.  - TSH  7. Need for hepatitis C screening  test: - Hepatitis C antibody to screen for hepatitis C.  - Hepatitis C Antibody  8. Vasectomy evaluation: - Referral to Urology for further evaluation and management.  - Ambulatory referral to Urology    Patient was given the opportunity to ask questions.  Patient verbalized understanding of the plan and was able to repeat key elements of the plan. Patient was given clear instructions to go to Emergency Department or return to medical center if symptoms don't improve, worsen, or new problems develop.The patient verbalized understanding.   Orders Placed This Encounter  Procedures   Hepatitis C Antibody   CBC   Lipid panel   TSH   CMP14+EGFR   Hemoglobin A1c   Ambulatory referral to Urology    Return in about 1 year (around 09/27/2022) for Physical per patient preference.  Camillia Herter, NP

## 2021-09-27 ENCOUNTER — Other Ambulatory Visit: Payer: Self-pay

## 2021-09-27 ENCOUNTER — Ambulatory Visit (INDEPENDENT_AMBULATORY_CARE_PROVIDER_SITE_OTHER): Payer: BC Managed Care – PPO | Admitting: Family

## 2021-09-27 ENCOUNTER — Encounter: Payer: Self-pay | Admitting: Family

## 2021-09-27 VITALS — BP 132/86 | HR 78 | Temp 98.3°F | Resp 18 | Ht 68.03 in | Wt 171.6 lb

## 2021-09-27 DIAGNOSIS — Z1329 Encounter for screening for other suspected endocrine disorder: Secondary | ICD-10-CM

## 2021-09-27 DIAGNOSIS — Z131 Encounter for screening for diabetes mellitus: Secondary | ICD-10-CM

## 2021-09-27 DIAGNOSIS — Z0000 Encounter for general adult medical examination without abnormal findings: Secondary | ICD-10-CM

## 2021-09-27 DIAGNOSIS — Z3009 Encounter for other general counseling and advice on contraception: Secondary | ICD-10-CM

## 2021-09-27 DIAGNOSIS — Z130 Encounter for screening for diseases of the blood and blood-forming organs and certain disorders involving the immune mechanism: Secondary | ICD-10-CM

## 2021-09-27 DIAGNOSIS — Z1159 Encounter for screening for other viral diseases: Secondary | ICD-10-CM | POA: Diagnosis not present

## 2021-09-27 DIAGNOSIS — Z13220 Encounter for screening for lipoid disorders: Secondary | ICD-10-CM

## 2021-09-27 DIAGNOSIS — Z13228 Encounter for screening for other metabolic disorders: Secondary | ICD-10-CM

## 2021-09-27 NOTE — Progress Notes (Signed)
Pt presents for annual physical exam, pt request information regarding a vasectomy

## 2021-09-27 NOTE — Patient Instructions (Signed)
Preventive Care 21-39 Years Old, Male ?Preventive care refers to lifestyle choices and visits with your health care provider that can promote health and wellness. Preventive care visits are also called wellness exams. ?What can I expect for my preventive care visit? ?Counseling ?During your preventive care visit, your health care provider may ask about your: ?Medical history, including: ?Past medical problems. ?Family medical history. ?Current health, including: ?Emotional well-being. ?Home life and relationship well-being. ?Sexual activity. ?Lifestyle, including: ?Alcohol, nicotine or tobacco, and drug use. ?Access to firearms. ?Diet, exercise, and sleep habits. ?Safety issues such as seatbelt and bike helmet use. ?Sunscreen use. ?Work and work environment. ?Physical exam ?Your health care provider may check your: ?Height and weight. These may be used to calculate your BMI (body mass index). BMI is a measurement that tells if you are at a healthy weight. ?Waist circumference. This measures the distance around your waistline. This measurement also tells if you are at a healthy weight and may help predict your risk of certain diseases, such as type 2 diabetes and high blood pressure. ?Heart rate and blood pressure. ?Body temperature. ?Skin for abnormal spots. ?What immunizations do I need? ?Vaccines are usually given at various ages, according to a schedule. Your health care provider will recommend vaccines for you based on your age, medical history, and lifestyle or other factors, such as travel or where you work. ?What tests do I need? ?Screening ?Your health care provider may recommend screening tests for certain conditions. This may include: ?Lipid and cholesterol levels. ?Diabetes screening. This is done by checking your blood sugar (glucose) after you have not eaten for a while (fasting). ?Hepatitis B test. ?Hepatitis C test. ?HIV (human immunodeficiency virus) test. ?STI (sexually transmitted infection)  testing, if you are at risk. ?Talk with your health care provider about your test results, treatment options, and if necessary, the need for more tests. ?Follow these instructions at home: ?Eating and drinking ? ?Eat a healthy diet that includes fresh fruits and vegetables, whole grains, lean protein, and low-fat dairy products. ?Drink enough fluid to keep your urine pale yellow. ?Take vitamin and mineral supplements as recommended by your health care provider. ?Do not drink alcohol if your health care provider tells you not to drink. ?If you drink alcohol: ?Limit how much you have to 0-2 drinks a day. ?Know how much alcohol is in your drink. In the [place] one drink equals one 12 oz bottle of beer (355 mL), one 5 oz glass of wine (148 mL), or one 1? oz glass of hard liquor (44 mL). ?Lifestyle ?Brush your teeth every morning and night with fluoride toothpaste. Floss one time each day. ?Exercise for at least 30 minutes 5 or more days each week. ?Do not use any products that contain nicotine or tobacco. These products include cigarettes, chewing tobacco, and vaping devices, such as e-cigarettes. If you need help quitting, ask your health care provider. ?Do not use drugs. ?If you are sexually active, practice safe sex. Use a condom or other form of protection to prevent STIs. ?Find healthy ways to manage stress, such as: ?Meditation, yoga, or listening to music. ?Journaling. ?Talking to a trusted person. ?Spending time with friends and family. ?Minimize exposure to UV radiation to reduce your risk of skin cancer. ?Safety ?Always wear your seat belt while driving or riding in a vehicle. ?Do not drive: ?If you have been drinking alcohol. Do not ride with someone who has been drinking. ?If you have been using any mind-altering substances or   drugs. ?While texting. ?When you are tired or distracted. ?Wear a helmet and other protective equipment during sports activities. ?If you have firearms in your house, make sure you  follow all gun safety procedures. ?Seek help if you have been physically or sexually abused. ?What's next? ?Go to your health care provider once a year for an annual wellness visit. ?Ask your health care provider how often you should have your eyes and teeth checked. ?Stay up to date on all vaccines. ?This information is not intended to replace advice given to you by your health care provider. Make sure you discuss any questions you have with your health care provider. ?Document Revised: 04/06/2021 Document Reviewed: 04/06/2021 ?Elsevier Patient Education ? 2022 [employer] ? ?

## 2021-09-28 LAB — CMP14+EGFR
ALT: 32 IU/L (ref 0–44)
AST: 31 IU/L (ref 0–40)
Albumin/Globulin Ratio: 2 (ref 1.2–2.2)
Albumin: 4.7 g/dL (ref 4.0–5.0)
Alkaline Phosphatase: 84 IU/L (ref 44–121)
BUN/Creatinine Ratio: 12 (ref 9–20)
BUN: 11 mg/dL (ref 6–20)
Bilirubin Total: 1.1 mg/dL (ref 0.0–1.2)
CO2: 23 mmol/L (ref 20–29)
Calcium: 9.9 mg/dL (ref 8.7–10.2)
Chloride: 100 mmol/L (ref 96–106)
Creatinine, Ser: 0.93 mg/dL (ref 0.76–1.27)
Globulin, Total: 2.3 g/dL (ref 1.5–4.5)
Glucose: 83 mg/dL (ref 70–99)
Potassium: 4.3 mmol/L (ref 3.5–5.2)
Sodium: 138 mmol/L (ref 134–144)
Total Protein: 7 g/dL (ref 6.0–8.5)
eGFR: 108 mL/min/{1.73_m2} (ref >59)

## 2021-09-28 LAB — CBC
Hematocrit: 47.4 % (ref 37.5–51.0)
Hemoglobin: 16.1 g/dL (ref 13.0–17.7)
MCH: 29.5 pg (ref 26.6–33.0)
MCHC: 34 g/dL (ref 31.5–35.7)
MCV: 87 fL (ref 79–97)
Platelets: 230 10*3/uL (ref 150–450)
RBC: 5.45 x10E6/uL (ref 4.14–5.80)
RDW: 12.8 % (ref 11.6–15.4)
WBC: 5.4 10*3/uL (ref 3.4–10.8)

## 2021-09-28 LAB — TSH: TSH: 0.985 u[IU]/mL (ref 0.450–4.500)

## 2021-09-28 LAB — HEPATITIS C ANTIBODY: Hep C Virus Ab: <0.1 s/co ratio (ref 0.0–0.9)

## 2021-09-28 LAB — LIPID PANEL
Chol/HDL Ratio: 5.1 ratio — ABNORMAL HIGH (ref 0.0–5.0)
Cholesterol, Total: 246 mg/dL — ABNORMAL HIGH (ref 100–199)
HDL: 48 mg/dL (ref >39)
LDL Chol Calc (NIH): 179 mg/dL — ABNORMAL HIGH (ref 0–99)
Triglycerides: 107 mg/dL (ref 0–149)
VLDL Cholesterol Cal: 19 mg/dL (ref 5–40)

## 2021-09-28 LAB — HEMOGLOBIN A1C
Est. average glucose Bld gHb Est-mCnc: 100 mg/dL
Hgb A1c MFr Bld: 5.1 % (ref 4.8–5.6)

## 2021-09-28 NOTE — Progress Notes (Signed)
Kidney function normal.   Liver function normal.   Thyroid function normal.   No diabetes.   No anemia.   Hepatitis C negative.   Cholesterol higher than expected but improved since 12  months ago. High cholesterol may increase risk of heart attack and/or stroke. Consider eating more fruits, vegetables, and lean baked meats such as chicken or fish. Moderate intensity exercise at least 150 minutes as tolerated per week may help as well. No medication needed as of present. Recommendation to have cholesterol rechecked in 3 months, please call our office to schedule.

## 2021-11-14 DIAGNOSIS — F5221 Male erectile disorder: Secondary | ICD-10-CM | POA: Diagnosis not present

## 2021-11-14 DIAGNOSIS — Z3009 Encounter for other general counseling and advice on contraception: Secondary | ICD-10-CM | POA: Diagnosis not present

## 2022-01-02 DIAGNOSIS — Z302 Encounter for sterilization: Secondary | ICD-10-CM | POA: Diagnosis not present

## 2022-04-03 DIAGNOSIS — F5221 Male erectile disorder: Secondary | ICD-10-CM | POA: Diagnosis not present

## 2022-05-30 DIAGNOSIS — H04123 Dry eye syndrome of bilateral lacrimal glands: Secondary | ICD-10-CM | POA: Diagnosis not present

## 2022-05-30 DIAGNOSIS — H43393 Other vitreous opacities, bilateral: Secondary | ICD-10-CM | POA: Diagnosis not present

## 2022-05-30 DIAGNOSIS — H5203 Hypermetropia, bilateral: Secondary | ICD-10-CM | POA: Diagnosis not present

## 2024-02-29 ENCOUNTER — Other Ambulatory Visit: Payer: Self-pay

## 2024-02-29 ENCOUNTER — Encounter: Payer: Self-pay | Admitting: *Deleted

## 2024-02-29 ENCOUNTER — Ambulatory Visit
Admission: EM | Admit: 2024-02-29 | Discharge: 2024-02-29 | Disposition: A | Discharge status: Home / Self Care | Attending: Internal Medicine | Admitting: Internal Medicine

## 2024-02-29 DIAGNOSIS — R079 Chest pain, unspecified: Secondary | ICD-10-CM

## 2024-02-29 NOTE — Discharge Instructions (Signed)
 It is unclear what is causing your chest discomfort and your evaluation has shown no signs of medical conditions requiring emergent intervention at this time. This is very reassuring! Your discomfort may be related to pain in the muscles and bones in your chest.   You may use over the counter tylenol  as needed for pain.  Apply heat to the chest wall 20 minutes on 20 minutes off and perform gentle stretches to the area.  If your symptoms do not improve in the next 2-3 days with interventions, please return.. Return to the Emergency Department if you experience worsening or uncontrolled chest pain, shortness of breath, light headedness, feeling faint, nausea, vomiting, or any other concerning symptoms. Otherwise, schedule a follow-up appointment with your PCP for follow-up.

## 2024-02-29 NOTE — ED Provider Notes (Signed)
 [name]    CSN: [id number] Arrival date & time: 02/29/24  1432      History   Chief Complaint Chief Complaint  Patient presents with   Chest Pain    HPI Benjamin Porter is a 43 y.o. male.   Benjamin Porter is a 43 y.o. male presenting for chief complaint of Chest Pain.  He experienced chest pain today after eating lunch while he was at work.  He works at [employer] as a Production designer, theatre/television/film.  Chest pain was localized to the general left chest, described as a pressure/soreness, and resolved on its own after 15 to 20 minutes or so.  He additionally states he started to feel slightly dizzy, hot, and flushed when chest pain happened.  He did not pass out and denies shortness of breath, leg swelling, orthopnea, nausea, vomiting, and vision changes.  No recent viral URI symptoms, fever, chills, heavy lifting, or changes in physical activity.  Never smoker, denies drug use.  Denies history of heart problems.  No family history of early death due to cardiac event.  He has a personal history of hyperlipidemia and treats this with herbal medication.  He walks 10 to 15 miles per day on the job at [employer].  Reports significant increase in stress over the last week at his job and wonders if this is contributing to his symptoms.  He had similar chest pain 3 to 4 years ago when he first started his job as a Production designer, theatre/television/film.  He additionally states he has not had much water to drink today and wonders if this could be contributing as well.  He has not attempted use of any over-the-counter medications to help with symptoms PTA.    Chest Pain   History reviewed. No pertinent past medical history.  Patient Active Problem List   Diagnosis Date Noted   Hyperlipidemia 03/02/2020    History reviewed. No pertinent surgical history.     Home Medications    Prior to Admission medications   Medication Sig Start Date End Date Taking? Authorizing Provider  Multiple Vitamin  (MULTIVITAMIN) tablet Take 1 tablet by mouth daily.    [provider]    Family History Family History  Problem Relation Age of Onset   Thyroid  disease Mother    Healthy Father     Social History Social History   Tobacco Use   Smoking status: Never   Smokeless tobacco: Never  Vaping Use   Vaping status: Never Used  Substance Use Topics   Alcohol use: Yes    Comment: occasional   Drug use: No     Allergies   Amoxicillin   Review of Systems Review of Systems  Cardiovascular:  Positive for chest pain.  Per HPI  Physical Exam Triage Vital Signs ED Triage Vitals  Encounter Vitals Group     BP 02/29/24 1449 127/75     Systolic BP Percentile --      Diastolic BP Percentile --      Pulse Rate 02/29/24 1449 87     Resp 02/29/24 1449 16     Temp 02/29/24 1449 98.6 F (37 C)     Temp Source 02/29/24 1449 Oral     SpO2 02/29/24 1449 95 %     Weight --      Height --      Head Circumference --      Peak Flow --      Pain Score 02/29/24 1439 4     Pain  Loc --      Pain Education --      Exclude from Growth Chart --    No data found.  Updated Vital Signs BP 127/75 (BP Location: Left Arm)   Pulse 87   Temp 98.6 F (37 C) (Oral)   Resp 16   SpO2 95%   Visual Acuity Right Eye Distance:   Left Eye Distance:   Bilateral Distance:    Right Eye Near:   Left Eye Near:    Bilateral Near:     Physical Exam Vitals and nursing note reviewed.  Constitutional:      Appearance: He is not ill-appearing or toxic-appearing.  HENT:     Head: Normocephalic and atraumatic.     Right Ear: Hearing and external ear normal.     Left Ear: Hearing and external ear normal.     Nose: Nose normal.     Mouth/Throat:     Lips: Pink.     Mouth: Mucous membranes are moist. No injury or oral lesions.     Dentition: Normal dentition.     Tongue: No lesions.     Pharynx: Oropharynx is clear. Uvula midline. No pharyngeal swelling, oropharyngeal exudate, posterior  oropharyngeal erythema, uvula swelling or postnasal drip.     Tonsils: No tonsillar exudate.  Eyes:     General: Lids are normal. Vision grossly intact. Gaze aligned appropriately.     Extraocular Movements: Extraocular movements intact.     Conjunctiva/sclera: Conjunctivae normal.  Neck:     Trachea: Trachea and phonation normal.  Cardiovascular:     Rate and Rhythm: Normal rate and regular rhythm.     Heart sounds: Normal heart sounds, S1 normal and S2 normal.  Pulmonary:     Effort: Pulmonary effort is normal. No respiratory distress.     Breath sounds: Normal breath sounds and air entry.  Musculoskeletal:     Cervical back: Neck supple.  Lymphadenopathy:     Cervical: No cervical adenopathy.  Skin:    General: Skin is warm and dry.     Capillary Refill: Capillary refill takes less than 2 seconds.     Findings: No rash.  Neurological:     General: No focal deficit present.     Mental Status: He is alert and oriented to person, place, and time. Mental status is at baseline.     Cranial Nerves: No dysarthria or facial asymmetry.  Psychiatric:        Mood and Affect: Mood normal.        Speech: Speech normal.        Behavior: Behavior normal.        Thought Content: Thought content normal.        Judgment: Judgment normal.      UC Treatments / Results  Labs (all labs ordered are listed, but only abnormal results are displayed) Labs Reviewed - No data to display  EKG   Radiology No results found.  Procedures Procedures (including critical care time)  Medications Ordered in UC Medications - No data to display  Initial Impression / Assessment and Plan / UC Course  I have reviewed the triage vital signs and the nursing notes.  Pertinent labs & imaging results that were available during my care of the patient were reviewed by me and considered in my medical decision making (see chart for details).   1.  Chest pain Emergent cause to patient's chest pain ruled out  today based on EKG, history, and physical exam.  Pericarditis, chest wall tenderness, acute MI, infectious process, acid reflux, and PE considered. Patient is low risk for acute coronary event based on risk factors.   Deferred imaging given clear cardiopulmonary exam, hemodynamically stable vital signs.  EKG shows normal sinus rhythm without any ST/T wave changes and normal intervals. Reviewed previous EKG for comparison.  Heart score indicates patient is low risk for ACS.   He is currently asymptomatic.  Recommend follow-up with PCP in 3-5 days and ER precautions for new or worsening symptoms.    Counseled patient on potential for adverse effects with medications prescribed/recommended today, strict ER and return-to-clinic precautions discussed, patient verbalized understanding.    Final Clinical Impressions(s) / UC Diagnoses   Final diagnoses:  Chest pain, unspecified type     Discharge Instructions      It is unclear what is causing your chest discomfort and your evaluation has shown no signs of medical conditions requiring emergent intervention at this time. This is very reassuring! Your discomfort may be related to pain in the muscles and bones in your chest.   You may use over the counter tylenol  as needed for pain.  Apply heat to the chest wall 20 minutes on 20 minutes off and perform gentle stretches to the area.  If your symptoms do not improve in the next 2-3 days with interventions, please return.. Return to the Emergency Department if you experience worsening or uncontrolled chest pain, shortness of breath, light headedness, feeling faint, nausea, vomiting, or any other concerning symptoms. Otherwise, schedule a follow-up appointment with your PCP for follow-up.    ED Prescriptions   None    PDMP not reviewed this encounter.   Starlene Eaton, [place] 02/29/24 [phone]

## 2024-02-29 NOTE — ED Triage Notes (Signed)
 Pt reports he just got from lunch and began having chest pressure and felt flushed and dizzy. He came here directly for evaluation. States he still feels "hot".
